# Patient Record
Sex: Female | Born: 1995 | Race: Black or African American | Hispanic: No | Marital: Single | Smoking: Never smoker
Health system: Southern US, Community
[De-identification: ages and names within clinical notes are randomized; demographics above are authoritative.]

## PROBLEM LIST (undated history)

## (undated) DIAGNOSIS — N289 Disorder of kidney and ureter, unspecified: Secondary | ICD-10-CM

## (undated) DIAGNOSIS — E119 Type 2 diabetes mellitus without complications: Secondary | ICD-10-CM

## (undated) DIAGNOSIS — F419 Anxiety disorder, unspecified: Secondary | ICD-10-CM

## (undated) DIAGNOSIS — I10 Essential (primary) hypertension: Secondary | ICD-10-CM

## (undated) HISTORY — PX: LAPAROSCOPIC GASTRIC RESTRICTIVE DUODENAL PROCEDURE (DUODENAL SWITCH): SHX6667

## (undated) HISTORY — PX: ABDOMINAL SURGERY: SHX537

## (undated) NOTE — *Deleted (*Deleted)
[facility]-EMERGENCY DEPT Provider Note   CSN: [id number] Arrival date & time: 06/12/20  1725     History Chief Complaint  Patient presents with  . Abdominal Pain    Olivia Landry is a 89 y.o. female.  HPI      Olivia Landry is a 53 y.o. female, with a history of ***, presenting to the ED with       Past Medical History:  Diagnosis Date  . Anxiety   . Diabetes mellitus without complication (HCC)   . Hypertension     There are no problems to display for this patient.   Past Surgical History:  Procedure Laterality Date  . ABDOMINAL SURGERY       OB History   No obstetric history on file.     No family history on file.  Social History   Tobacco Use  . Smoking status: Never Smoker  . Smokeless tobacco: Never Used  Vaping Use  . Vaping Use: Never used  Substance Use Topics  . Alcohol use: Not Currently  . Drug use: Yes    Types: Marijuana    Home Medications Prior to Admission medications   Not on File    Allergies    Amoxicillin, Keflex [cephalexin], Penicillins, and Sumatriptan  Review of Systems   Review of Systems  Physical Exam Updated Vital Signs BP 127/83 (BP Location: Right Arm)   Pulse 100   Temp 98.7 F (37.1 C) (Oral)   Resp (!) 24   Ht 5\' 3"  (1.6 m)   Wt 75.3 kg   LMP 05/27/2020   SpO2 96%   BMI 29.41 kg/m   Physical Exam  ED Results / Procedures / Treatments   Labs (all labs ordered are listed, but only abnormal results are displayed) Labs Reviewed  COMPREHENSIVE METABOLIC PANEL - Abnormal; Notable for the following components:      Result Value   CO2 21 (*)    Glucose, Bld 116 (*)    AST 62 (*)    All other components within normal limits  CBC - Abnormal; Notable for the following components:   RBC 3.17 (*)    Hemoglobin 9.7 (*)    HCT 30.4 (*)    All other components within normal limits  RESPIRATORY PANEL BY RT PCR (FLU A&B, COVID)  LIPASE, BLOOD  URINALYSIS, ROUTINE W REFLEX  MICROSCOPIC  PREGNANCY, URINE  BRAIN NATRIURETIC PEPTIDE  ETHANOL    EKG EKG Interpretation  Date/Time:  Sunday June 12 2020 19:33:45 EST Ventricular Rate:  86 PR Interval:    QRS Duration: 91 QT Interval:  343 QTC Calculation: 411 R Axis:   102 Text Interpretation: Sinus rhythm Borderline right axis deviation Borderline T abnormalities, inferior leads No old tracing to compare Confirmed by Pricilla Loveless [phone]) on 06/12/2020 7:42:52 PM   Radiology DG Chest 2 View  Result Date: 06/12/2020 CLINICAL DATA:  Abdominal pain and distention x2 weeks. EXAM: CHEST - 2 VIEW COMPARISON:  None. FINDINGS: The heart size and mediastinal contours are within normal limits. Both lungs are clear. The visualized skeletal structures are unremarkable. IMPRESSION: No active cardiopulmonary disease. Electronically Signed   By: Katherine Mantle M.D.   On: 06/12/2020 20:25   CT ABDOMEN PELVIS W CONTRAST  Addendum Date: 06/12/2020   ADDENDUM REPORT: 06/12/2020 20:39 ADDENDUM: These results were called by telephone at the time of interpretation on 06/12/2020 at 8:39 pm to provider Pricilla Loveless MD , who verbally acknowledged these results. Electronically Signed  By: Kreg Shropshire M.D.   On: 06/12/2020 20:39   Result Date: 06/12/2020 CLINICAL DATA:  Abdominal distension EXAM: CT ABDOMEN AND PELVIS WITH CONTRAST TECHNIQUE: Multidetector CT imaging of the abdomen and pelvis was performed using the standard protocol following bolus administration of intravenous contrast. CONTRAST:  OMNIPAQUE IOHEXOL 300 MG/ML  SOLN COMPARISON:  None FINDINGS: Lower chest: Lung bases are clear. Normal heart size. No pericardial effusion. Hepatobiliary: No focal liver abnormality is seen. Patient is post cholecystectomy. Slight prominence of the biliary tree likely related to reservoir effect. No calcified intraductal gallstones. Pancreas: No pancreatic ductal dilatation or surrounding inflammatory changes. Spleen: Normal in  size. No concerning splenic lesions. Adrenals/Urinary Tract: Normal adrenal glands. Kidneys are normally located with symmetric enhancement. No suspicious renal lesion, urolithiasis or hydronephrosis. Bladder is unremarkable. Stomach/Bowel: Challenging assessment of the bowel given fairly diffuse distension, lack of intraperitoneal fat and postsurgical changes compatible with a duodenal switch procedure. 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 Additionally, there is fairly diffuse fluid distention of loops of the more proximal biliopancreatic limb clustered in the left upper quadrant predominantly involving the jejunal segments. Beyond the region of twisting much of the small bowel is more decompressed and normal caliber albeit with extensive fecalization of the small bowel contents which could reflect some slowed intestinal transit. There is some moderate distention of the colonic segments proximally to the level of the splenic flexure where there is some irregular mural thickening possibly related to peristaltic contraction on a background of more diffuse distal colonic mural thickening. Vascular/Lymphatic: No significant vascular findings are present. No enlarged abdominal or pelvic lymph nodes. Reproductive: Normal appearance of the uterus and adnexal structures. Collapsing corpus luteum in the right ovary is a physiologically normal finding. Other: Diffuse edematous changes of the mesentery. Small volume low-attenuation free fluid in the pelvis, nonspecific. Diffuse body wall edema. Small amount of focal fluid in the right gluteal soft tissues as well. Could correlate for a small hematoma or recent injection. Musculoskeletal: No acute osseous abnormality or suspicious osseous lesion. IMPRESSION: 1.  Challenging assessment of the bowel given fairly diffuse distension, mesenteric edema, lack of intraperitoneal fat and postsurgical changes compatible with a duodenal switch procedure. 2. There is fairly significant distention of the alimentary limb with a site of mesenteric twisting in the right lower quadrant where there is some abrupt narrowing just beyond the entero-enteric suture line. Additionally, there is fairly diffuse fluid distention of the biliopancreatic limb proximal to this twisting as well. Beyond the narrowing much of the small bowel is more decompressed and normal caliber albeit with extensive fecalization of the small bowel contents which could reflect some slowed intestinal transit. 3. There is some moderate distention of the colonic segments proximally to the level of the splenic flexure where there is some irregular mural thickening possibly related to peristaltic contraction on a background of more diffuse distal colonic mural thickening of the splenic flexure through the rectosigmoid. Findings are nonspecific, but could reflect a colitis of either infectious or inflammatory etiology though may warrant further evaluation with outpatient direct visualization following resolution of acute symptoms. 4. Small volume low-attenuation free fluid in the pelvis, may be physiologic albeit with additional features of anasarca including diffuse body wall and mesenteric edema. 5. Small amount of non organized though fairly focal fluid  in the right gluteal soft tissues as well. Could correlate for a small hematoma or recent injection. Electronically Signed: By: Kreg Shropshire M.D. On: 06/12/2020 20:25    Procedures Procedures (including critical care time)  Medications Ordered in ED Medications  ketorolac (TORADOL) 15 MG/ML injection 15 mg (15 mg Intravenous Refused 06/12/20 2054)  iohexol (OMNIPAQUE) 300 MG/ML solution 100 mL (100 mLs Intravenous Contrast Given 06/12/20 1948)  morphine 4 MG/ML  injection 4 mg (4 mg Intravenous Given 06/12/20 2049)    ED Course  I have reviewed the triage vital signs and the nursing notes.  Pertinent labs & imaging results that were available during my care of the patient were reviewed by me and considered in my medical decision making (see chart for details).  Clinical Course as of Jun 13 2231  Wynelle Link Jun 12, 2020  2225 Spoke with Dr. Carolynne Edouard, general surgery. Notified him of patient's arrival at [facility] ED. He states he plans on coming to see her.    [SJ]    Clinical Course User Index [SJ] Deantae Shackleton C, PA-C   MDM Rules/Calculators/A&P                          *** Final Clinical Impression(s) / ED Diagnoses Final diagnoses:  Intestinal obstruction, unspecified cause, unspecified whether partial or complete (HCC)  Abdominal pain, unspecified abdominal location  Edema, unspecified type    Rx / DC Orders ED Discharge Orders    None

---
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
## 2015-07-20 DIAGNOSIS — M21069 Valgus deformity, not elsewhere classified, unspecified knee: Secondary | ICD-10-CM | POA: Insufficient documentation

## 2015-07-20 DIAGNOSIS — M222X9 Patellofemoral disorders, unspecified knee: Secondary | ICD-10-CM | POA: Insufficient documentation

## 2015-11-11 DIAGNOSIS — E038 Other specified hypothyroidism: Secondary | ICD-10-CM | POA: Insufficient documentation

## 2015-11-11 DIAGNOSIS — E063 Autoimmune thyroiditis: Secondary | ICD-10-CM | POA: Insufficient documentation

## 2016-12-26 DIAGNOSIS — IMO0001 Reserved for inherently not codable concepts without codable children: Secondary | ICD-10-CM | POA: Insufficient documentation

## 2017-02-08 DIAGNOSIS — F319 Bipolar disorder, unspecified: Secondary | ICD-10-CM | POA: Insufficient documentation

## 2017-02-08 DIAGNOSIS — F909 Attention-deficit hyperactivity disorder, unspecified type: Secondary | ICD-10-CM | POA: Insufficient documentation

## 2017-02-08 DIAGNOSIS — I10 Essential (primary) hypertension: Secondary | ICD-10-CM | POA: Insufficient documentation

## 2017-06-17 DIAGNOSIS — Z9884 Bariatric surgery status: Secondary | ICD-10-CM | POA: Insufficient documentation

## 2017-09-19 DIAGNOSIS — R600 Localized edema: Secondary | ICD-10-CM | POA: Insufficient documentation

## 2017-09-24 DIAGNOSIS — Z4889 Encounter for other specified surgical aftercare: Secondary | ICD-10-CM | POA: Insufficient documentation

## 2017-09-30 DIAGNOSIS — E669 Obesity, unspecified: Secondary | ICD-10-CM | POA: Insufficient documentation

## 2017-09-30 DIAGNOSIS — Z6833 Body mass index (BMI) 33.0-33.9, adult: Secondary | ICD-10-CM | POA: Insufficient documentation

## 2017-09-30 DIAGNOSIS — L259 Unspecified contact dermatitis, unspecified cause: Secondary | ICD-10-CM | POA: Insufficient documentation

## 2018-03-27 DIAGNOSIS — E113299 Type 2 diabetes mellitus with mild nonproliferative diabetic retinopathy without macular edema, unspecified eye: Secondary | ICD-10-CM | POA: Insufficient documentation

## 2018-04-29 DIAGNOSIS — F32A Depression, unspecified: Secondary | ICD-10-CM | POA: Insufficient documentation

## 2018-04-29 DIAGNOSIS — K219 Gastro-esophageal reflux disease without esophagitis: Secondary | ICD-10-CM | POA: Insufficient documentation

## 2020-06-12 ENCOUNTER — Observation Stay (HOSPITAL_BASED_OUTPATIENT_CLINIC_OR_DEPARTMENT_OTHER)
Admission: EM | Admit: 2020-06-12 | Discharge: 2020-06-13 | Disposition: A | Discharge status: Home / Self Care | Payer: BC Managed Care – PPO | Attending: Surgery | Admitting: Surgery

## 2020-06-12 ENCOUNTER — Other Ambulatory Visit: Payer: Self-pay

## 2020-06-12 ENCOUNTER — Emergency Department (HOSPITAL_BASED_OUTPATIENT_CLINIC_OR_DEPARTMENT_OTHER): Payer: BC Managed Care – PPO

## 2020-06-12 ENCOUNTER — Encounter (HOSPITAL_BASED_OUTPATIENT_CLINIC_OR_DEPARTMENT_OTHER): Payer: Self-pay | Admitting: Emergency Medicine

## 2020-06-12 DIAGNOSIS — K219 Gastro-esophageal reflux disease without esophagitis: Secondary | ICD-10-CM | POA: Insufficient documentation

## 2020-06-12 DIAGNOSIS — I10 Essential (primary) hypertension: Secondary | ICD-10-CM | POA: Insufficient documentation

## 2020-06-12 DIAGNOSIS — F909 Attention-deficit hyperactivity disorder, unspecified type: Secondary | ICD-10-CM | POA: Diagnosis not present

## 2020-06-12 DIAGNOSIS — R109 Unspecified abdominal pain: Secondary | ICD-10-CM

## 2020-06-12 DIAGNOSIS — R1084 Generalized abdominal pain: Secondary | ICD-10-CM | POA: Diagnosis present

## 2020-06-12 DIAGNOSIS — K56609 Unspecified intestinal obstruction, unspecified as to partial versus complete obstruction: Principal | ICD-10-CM | POA: Insufficient documentation

## 2020-06-12 DIAGNOSIS — Z79899 Other long term (current) drug therapy: Secondary | ICD-10-CM | POA: Diagnosis not present

## 2020-06-12 DIAGNOSIS — Z7984 Long term (current) use of oral hypoglycemic drugs: Secondary | ICD-10-CM | POA: Insufficient documentation

## 2020-06-12 DIAGNOSIS — Z20822 Contact with and (suspected) exposure to covid-19: Secondary | ICD-10-CM | POA: Insufficient documentation

## 2020-06-12 DIAGNOSIS — R609 Edema, unspecified: Secondary | ICD-10-CM

## 2020-06-12 DIAGNOSIS — R2243 Localized swelling, mass and lump, lower limb, bilateral: Secondary | ICD-10-CM | POA: Diagnosis not present

## 2020-06-12 DIAGNOSIS — R0602 Shortness of breath: Secondary | ICD-10-CM | POA: Diagnosis not present

## 2020-06-12 DIAGNOSIS — E119 Type 2 diabetes mellitus without complications: Secondary | ICD-10-CM | POA: Insufficient documentation

## 2020-06-12 DIAGNOSIS — R110 Nausea: Secondary | ICD-10-CM

## 2020-06-12 HISTORY — DX: Anxiety disorder, unspecified: F41.9

## 2020-06-12 HISTORY — DX: Type 2 diabetes mellitus without complications: E11.9

## 2020-06-12 HISTORY — DX: Essential (primary) hypertension: I10

## 2020-06-12 LAB — COMPREHENSIVE METABOLIC PANEL
ALT: 41 U/L (ref 0–44)
AST: 62 U/L — ABNORMAL HIGH (ref 15–41)
Albumin: 4.1 g/dL (ref 3.5–5.0)
Alkaline Phosphatase: 46 U/L (ref 38–126)
Anion gap: 11 (ref 5–15)
BUN: 20 mg/dL (ref 6–20)
CO2: 21 mmol/L — ABNORMAL LOW (ref 22–32)
Calcium: 9.1 mg/dL (ref 8.9–10.3)
Chloride: 108 mmol/L (ref 98–111)
Creatinine, Ser: 0.79 mg/dL (ref 0.44–1.00)
GFR, Estimated: >60 mL/min (ref >60)
Glucose, Bld: 116 mg/dL — ABNORMAL HIGH (ref 70–99)
Potassium: 3.9 mmol/L (ref 3.5–5.1)
Sodium: 140 mmol/L (ref 135–145)
Total Bilirubin: 0.7 mg/dL (ref 0.3–1.2)
Total Protein: 6.8 g/dL (ref 6.5–8.1)

## 2020-06-12 LAB — RESPIRATORY PANEL BY RT PCR (FLU A&B, COVID)
Influenza A by PCR: NEGATIVE
Influenza B by PCR: NEGATIVE
SARS Coronavirus 2 by RT PCR: NEGATIVE

## 2020-06-12 LAB — URINALYSIS, ROUTINE W REFLEX MICROSCOPIC
Bilirubin Urine: NEGATIVE
Glucose, UA: NEGATIVE mg/dL
Hgb urine dipstick: NEGATIVE
Ketones, ur: NEGATIVE mg/dL
Leukocytes,Ua: NEGATIVE
Nitrite: NEGATIVE
Protein, ur: NEGATIVE mg/dL
Specific Gravity, Urine: 1.02 (ref 1.005–1.030)
pH: 6 (ref 5.0–8.0)

## 2020-06-12 LAB — LIPASE, BLOOD: Lipase: 34 U/L (ref 11–51)

## 2020-06-12 LAB — CBC
HCT: 30.4 % — ABNORMAL LOW (ref 36.0–46.0)
Hemoglobin: 9.7 g/dL — ABNORMAL LOW (ref 12.0–15.0)
MCH: 30.6 pg (ref 26.0–34.0)
MCHC: 31.9 g/dL (ref 30.0–36.0)
MCV: 95.9 fL (ref 80.0–100.0)
Platelets: 331 10*3/uL (ref 150–400)
RBC: 3.17 MIL/uL — ABNORMAL LOW (ref 3.87–5.11)
RDW: 12.8 % (ref 11.5–15.5)
WBC: 5.9 10*3/uL (ref 4.0–10.5)
nRBC: 0 % (ref 0.0–0.2)

## 2020-06-12 LAB — ETHANOL: Alcohol, Ethyl (B): <10 mg/dL (ref <10)

## 2020-06-12 LAB — PREGNANCY, URINE: Preg Test, Ur: NEGATIVE

## 2020-06-12 LAB — BRAIN NATRIURETIC PEPTIDE: B Natriuretic Peptide: 14.1 pg/mL (ref 0.0–100.0)

## 2020-06-12 MED ORDER — MORPHINE SULFATE (PF) 4 MG/ML IV SOLN
4.0000 mg | Freq: Once | INTRAVENOUS | Status: AC
  Administered 2020-06-12: 4 mg via INTRAVENOUS
  Filled 2020-06-12: qty 1

## 2020-06-12 MED ORDER — IOHEXOL 300 MG/ML  SOLN
100.0000 mL | Freq: Once | INTRAMUSCULAR | Status: AC
  Administered 2020-06-12: 100 mL via INTRAVENOUS

## 2020-06-12 MED ORDER — MORPHINE SULFATE (PF) 4 MG/ML IV SOLN
4.0000 mg | Freq: Once | INTRAVENOUS | Status: AC
  Administered 2020-06-13: 4 mg via INTRAVENOUS
  Filled 2020-06-12: qty 1

## 2020-06-12 MED ORDER — KETOROLAC TROMETHAMINE 15 MG/ML IJ SOLN
15.0000 mg | Freq: Once | INTRAMUSCULAR | Status: DC
  Filled 2020-06-12: qty 1

## 2020-06-12 NOTE — ED Triage Notes (Signed)
Reports abdomen has been distended and painful for the last two weeks.  Hx of duodenal switch sx in 2017.  Denies any n/v/d.  Also co swelling to bil lower ext for the last two weeks.

## 2020-06-12 NOTE — ED Provider Notes (Signed)
[facility] EMERGENCY DEPARTMENT Provider Note   CSN: [id number] Arrival date & time: 06/12/20  1725     History Chief Complaint  Patient presents with  . Abdominal Pain    Olivia Landry is a 24 y.o. female presenting for evaluation of abdominal pain and swelling.  Patient states she has had gradual weight gain over the past several weeks.  In the past week, she reports a weight gain of 13 pounds.  The past 2 weeks, she has had worsening abdominal pain and swelling, pain is worse on the right side.  She treated with Linzess, which is helped with her constipation before, and reports increased gas and bowel output, but no improvement of symptoms.  She has had abdominal bloating before due to constipation, but usually resolves quickly.  She denies fevers, chills, chest pain, nausea, vomiting, urinary symptoms.  She has blood in her stools. Last mild leg swelling, patient denies a history of similar.  However she states she is on Lasix, does not know why.  She does report mild dyspnea on exertion and orthopnea over the past several weeks. Patient with significant surgical history including lap duodenal switch in 2017 at [facility] in [place], bariatric surgery, lap chole.   Additional history obtained in chart review.  Patient with a history of ADHD, anxiety, depression, diabetes, GERD, hypertension, edema.  She is on BuSpar, Hyzaar, Wellbutrin, Celexa, Catapres, Adderall, iron, Lasix, Neurontin, Lamictal, lithium, Metformin, Trileptal. Recently d/c ozempic.   HPI     Past Medical History:  Diagnosis Date  . Anxiety   . Diabetes mellitus without complication (HCC)   . Hypertension     There are no problems to display for this patient.   Past Surgical History:  Procedure Laterality Date  . ABDOMINAL SURGERY       OB History   No obstetric history on file.     No family history on file.  Social History   Tobacco Use  . Smoking status: Never Smoker  . Smokeless  tobacco: Never Used  Vaping Use  . Vaping Use: Never used  Substance Use Topics  . Alcohol use: Not Currently  . Drug use: Yes    Types: Marijuana    Home Medications Prior to Admission medications   Not on File    Allergies    Amoxicillin, Keflex [cephalexin], Penicillins, and Sumatriptan  Review of Systems   Review of Systems  Constitutional: Positive for unexpected weight change.  Respiratory: Positive for shortness of breath.   Cardiovascular: Positive for leg swelling.  Gastrointestinal: Positive for abdominal distention and abdominal pain.  All other systems reviewed and are negative.   Physical Exam Updated Vital Signs BP 132/85 (BP Location: Right Arm)   Pulse 93   Temp 98.7 F (37.1 C) (Oral)   Resp 18   Ht 5\' 3"  (1.6 m)   Wt 75.3 kg   LMP 05/27/2020   SpO2 100%   BMI 29.41 kg/m   Physical Exam Vitals and nursing note reviewed.  Constitutional:      General: She is not in acute distress.    Appearance: She is well-developed.     Comments: Appears nontoxic  HENT:     Head: Normocephalic and atraumatic.  Eyes:     Conjunctiva/sclera: Conjunctivae normal.     Pupils: Pupils are equal, round, and reactive to light.  Cardiovascular:     Rate and Rhythm: Normal rate and regular rhythm.     Pulses: Normal pulses.  Pulmonary:  Effort: Pulmonary effort is normal. No respiratory distress.     Breath sounds: Normal breath sounds. No wheezing.     Comments: Clear lung sounds Abdominal:     General: There is no distension.     Palpations: Abdomen is soft. There is no mass.     Tenderness: There is abdominal tenderness. There is no guarding or rebound.     Comments: Mild tenderness palpation of the right side abdomen.  No rigidity, guarding, distention.  Negative rebound.  Musculoskeletal:        General: Normal range of motion.     Cervical back: Normal range of motion and neck supple.     Right lower leg: Edema present.     Left lower leg: Edema  present.     Comments: Mild bilateral lower extremity edema.  Skin:    General: Skin is warm and dry.  Neurological:     Mental Status: She is alert and oriented to person, place, and time.     ED Results / Procedures / Treatments   Labs (all labs ordered are listed, but only abnormal results are displayed) Labs Reviewed  COMPREHENSIVE METABOLIC PANEL - Abnormal; Notable for the following components:      Result Value   CO2 21 (*)    Glucose, Bld 116 (*)    AST 62 (*)    All other components within normal limits  CBC - Abnormal; Notable for the following components:   RBC 3.17 (*)    Hemoglobin 9.7 (*)    HCT 30.4 (*)    All other components within normal limits  LIPASE, BLOOD  URINALYSIS, ROUTINE W REFLEX MICROSCOPIC  PREGNANCY, URINE  BRAIN NATRIURETIC PEPTIDE  ETHANOL    EKG EKG Interpretation  Date/Time:  Sunday June 12 2020 19:33:45 EST Ventricular Rate:  86 PR Interval:    QRS Duration: 91 QT Interval:  343 QTC Calculation: 411 R Axis:   102 Text Interpretation: Sinus rhythm Borderline right axis deviation Borderline T abnormalities, inferior leads No old tracing to compare Confirmed by Pricilla Loveless [phone]) on 06/12/2020 7:42:52 PM   Radiology DG Chest 2 View  Result Date: 06/12/2020 CLINICAL DATA:  Abdominal pain and distention x2 weeks. EXAM: CHEST - 2 VIEW COMPARISON:  None. FINDINGS: The heart size and mediastinal contours are within normal limits. Both lungs are clear. The visualized skeletal structures are unremarkable. IMPRESSION: No active cardiopulmonary disease. Electronically Signed   By: Katherine Mantle M.D.   On: 06/12/2020 20:25   CT ABDOMEN PELVIS W CONTRAST  Addendum Date: 06/12/2020   ADDENDUM REPORT: 06/12/2020 20:39 ADDENDUM: These results were called by telephone at the time of interpretation on 06/12/2020 at 8:39 pm to provider Pricilla Loveless MD , who verbally acknowledged these results. Electronically Signed   By: Kreg Shropshire  M.D.   On: 06/12/2020 20:39   Result Date: 06/12/2020 CLINICAL DATA:  Abdominal distension EXAM: CT ABDOMEN AND PELVIS WITH CONTRAST TECHNIQUE: Multidetector CT imaging of the abdomen and pelvis was performed using the standard protocol following bolus administration of intravenous contrast. CONTRAST:  OMNIPAQUE IOHEXOL 300 MG/ML  SOLN COMPARISON:  None FINDINGS: Lower chest: Lung bases are clear. Normal heart size. No pericardial effusion. Hepatobiliary: No focal liver abnormality is seen. Patient is post cholecystectomy. Slight prominence of the biliary tree likely related to reservoir effect. No calcified intraductal gallstones. Pancreas: No pancreatic ductal dilatation or surrounding inflammatory changes. Spleen: Normal in size. No concerning splenic lesions. Adrenals/Urinary Tract: Normal adrenal glands. Kidneys  are normally located with symmetric enhancement. No suspicious renal lesion, urolithiasis or hydronephrosis. Bladder is unremarkable. Stomach/Bowel: Challenging assessment of the bowel given fairly diffuse distension, lack of intraperitoneal fat and postsurgical changes compatible with a duodenal switch procedure. 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 Additionally, there is fairly diffuse fluid distention of loops of the more proximal biliopancreatic limb clustered in the left upper quadrant predominantly involving the jejunal segments. Beyond the region of twisting much of the small bowel is more decompressed and normal caliber albeit with extensive fecalization of the small bowel contents which could reflect some slowed intestinal transit. There is some moderate distention of the colonic segments proximally to the level of the splenic flexure where there is some irregular mural  thickening possibly related to peristaltic contraction on a background of more diffuse distal colonic mural thickening. Vascular/Lymphatic: No significant vascular findings are present. No enlarged abdominal or pelvic lymph nodes. Reproductive: Normal appearance of the uterus and adnexal structures. Collapsing corpus luteum in the right ovary is a physiologically normal finding. Other: Diffuse edematous changes of the mesentery. Small volume low-attenuation free fluid in the pelvis, nonspecific. Diffuse body wall edema. Small amount of focal fluid in the right gluteal soft tissues as well. Could correlate for a small hematoma or recent injection. Musculoskeletal: No acute osseous abnormality or suspicious osseous lesion. IMPRESSION: 1. Challenging assessment of the bowel given fairly diffuse distension, mesenteric edema, lack of intraperitoneal fat and postsurgical changes compatible with a duodenal switch procedure. 2. There is fairly significant distention of the alimentary limb with a site of mesenteric twisting in the right lower quadrant where there is some abrupt narrowing just beyond the entero-enteric suture line. Additionally, there is fairly diffuse fluid distention of the biliopancreatic limb proximal to this twisting as well. Beyond the narrowing much of the small bowel is more decompressed and normal caliber albeit with extensive fecalization of the small bowel contents which could reflect some slowed intestinal transit. 3. There is some moderate distention of the colonic segments proximally to the level of the splenic flexure where there is some irregular mural thickening possibly related to peristaltic contraction on a background of more diffuse distal colonic mural thickening of the splenic flexure through the rectosigmoid. Findings are nonspecific, but could reflect a colitis of either infectious or inflammatory etiology though may warrant further evaluation with outpatient direct visualization  following resolution of acute symptoms. 4. Small volume low-attenuation free fluid in the pelvis, may be physiologic albeit with additional features of anasarca including diffuse body wall and mesenteric edema. 5. Small amount of non organized though fairly focal fluid in the right gluteal soft tissues as well. Could correlate for a small hematoma or recent injection. Electronically Signed: By: Kreg ShropshirePrice  DeHay M.D. On: 06/12/2020 20:25    Procedures Procedures (including critical care time)  Medications Ordered in ED Medications  ketorolac (TORADOL) 15 MG/ML injection 15 mg (15 mg Intravenous Refused 06/12/20 2054)  iohexol (OMNIPAQUE) 300 MG/ML solution 100 mL (100 mLs Intravenous Contrast Given 06/12/20 1948)  morphine 4 MG/ML injection 4 mg (4 mg Intravenous Given 06/12/20 2049)    ED Course  I have reviewed the triage vital signs and the nursing notes.  Pertinent labs & imaging results that were available during my care of the patient were reviewed by me and considered  in my medical decision making (see chart for details).    MDM Rules/Calculators/A&P                          Patient presenting for evaluation of abdominal pain and edema.  On exam, patient appears nontoxic.  She does have mild right-sided abdominal pain.  She also is noted to have peripheral edema.  Labs obtained from triage interpreted by me, overall reassuring.  No leukocytosis.  Electrolytes are stable.  Lipase normal.  UA without infection.  Considering her significant abdominal surgery, she will need CT imaging for further evaluation of symptoms.  Consider obstruction, infection, liver pathology.  Additionally, patient reporting shortness of breath and with edema, will obtain BNP and chest x-ray.  BNP negative.  Chest x-ray viewed interpreted by me, no pneumonia, proximal effusion.  CT abdomen pelvis with abnormal read, per radiologist there is concern for obstruction.  Patient with twisting of the right side abdomen with  subsequent distention.  Will consult general surgery.  Discussed with Dr. Carolynne Edouard from general surgery who requested ED to ED transfer so he can evaluate and admit the patient.  Requests call when patient arrives at [facility], ER. Discussed findings and plan with pt, who is agreeable.   Discussed with Dr. Jeraldine Loots who accepts patient for transfer.  Final Clinical Impression(s) / ED Diagnoses Final diagnoses:  None    Rx / DC Orders ED Discharge Orders    None       Alveria Apley, PA-C 06/12/20 2128    Pricilla Loveless, MD 06/15/20 [phone]

## 2020-06-12 NOTE — H&P (Signed)
Olivia Landry is an 24 y.o. female.   Chief Complaint: abd pressure HPI: The patient is a 24 year old black female who presents with abdominal pressure that is been going on for the last couple weeks.  She had one episode of vomiting about a week ago.  Otherwise she has about 8 bowel movements a day.  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  She denies any fevers or chills.  She came to the emergency department where a CT scan shows dilation of both the large and small intestine.  There is mesenteric edema.  Past Medical History:  Diagnosis Date  . Anxiety   . Diabetes mellitus without complication (HCC)   . Hypertension     Past Surgical History:  Procedure Laterality Date  . ABDOMINAL SURGERY      No family history on file. Social History:  reports that she has never smoked. She has never used smokeless tobacco. She reports previous alcohol use. She reports current drug use. Drug: Marijuana.  Allergies:  Allergies  Allergen Reactions  . Amoxicillin Hives  . Keflex [Cephalexin] Hives  . Penicillins Hives  . Sumatriptan Other (See Comments)    Nausea, dizzy, hot, and break out in sweats.    (Not in a hospital admission)   Results for orders placed or performed during the hospital encounter of 06/12/20 (from the past 48 hour(s))  Lipase, blood     Status: None   Collection Time: 06/12/20  5:45 PM  Result Value Ref Range   Lipase 34 11 - 51 U/L    Comment: Performed at [facility], [address] [place], [place] [address]  Comprehensive metabolic panel     Status: Abnormal   Collection Time: 06/12/20  5:45 PM  Result Value Ref Range   Sodium 140 135 - 145 mmol/L   Potassium 3.9 3.5 - 5.1 mmol/L   Chloride 108 98 - 111 mmol/L   CO2 21 (L) 22 - 32 mmol/L   Glucose, Bld 116 (H) 70 - 99 mg/dL    Comment: Glucose reference range applies only to samples taken after fasting for at least 8 hours.    BUN 20 6 - 20 mg/dL   Creatinine, Ser 9.62 0.44 - 1.00 mg/dL   Calcium 9.1 8.9 - 83.6 mg/dL   Total Protein 6.8 6.5 - 8.1 g/dL   Albumin 4.1 3.5 - 5.0 g/dL   AST 62 (H) 15 - 41 U/L   ALT 41 0 - 44 U/L   Alkaline Phosphatase 46 38 - 126 U/L   Total Bilirubin 0.7 0.3 - 1.2 mg/dL   GFR, Estimated >62 >94 mL/min    Comment: (NOTE) Calculated using the CKD-EPI Creatinine Equation (2021)    Anion gap 11 5 - 15    Comment: Performed at [facility], [address] [place], [place] [address]  CBC     Status: Abnormal   Collection Time: 06/12/20  5:45 PM  Result Value Ref Range   WBC 5.9 4.0 - 10.5 K/uL   RBC 3.17 (L) 3.87 - 5.11 MIL/uL   Hemoglobin 9.7 (L) 12.0 - 15.0 g/dL   HCT 50.3 (L) 36 - 46 %   MCV 95.9 80.0 - 100.0 fL   MCH 30.6 26.0 - 34.0 pg   MCHC 31.9 30.0 - 36.0 g/dL   RDW 54.6 56.8 - 12.7 %   Platelets 331 150 - 400 K/uL  nRBC 0.0 0.0 - 0.2 %    Comment: Performed at [facility] [place], [address] [place], [place] [address]  Urinalysis, Routine w reflex microscopic     Status: None   Collection Time: 06/12/20  5:51 PM  Result Value Ref Range   Color, Urine YELLOW YELLOW   APPearance CLEAR CLEAR   Specific Gravity, Urine 1.020 1.005 - 1.030   pH 6.0 5.0 - 8.0   Glucose, UA NEGATIVE NEGATIVE mg/dL   Hgb urine dipstick NEGATIVE NEGATIVE   Bilirubin Urine NEGATIVE NEGATIVE   Ketones, ur NEGATIVE NEGATIVE mg/dL   Protein, ur NEGATIVE NEGATIVE mg/dL   Nitrite NEGATIVE NEGATIVE   Leukocytes,Ua NEGATIVE NEGATIVE    Comment: Microscopic not done on urines with negative protein, blood, leukocytes, nitrite, or glucose < 500 mg/dL. Performed at [facility] [place], [address] [place], [place] [address]   Pregnancy, urine     Status: None   Collection Time: 06/12/20  5:51 PM  Result Value Ref Range   Preg Test, Ur NEGATIVE NEGATIVE    Comment:        THE SENSITIVITY OF THIS METHODOLOGY IS >20 mIU/mL. Performed at [facility] [place], [address] [place], [place] [address]   Brain natriuretic peptide     Status: None   Collection Time: 06/12/20  7:30 PM  Result Value Ref Range   B Natriuretic Peptide 14.1 0.0 - 100.0 pg/mL    Comment: Performed at [facility] [place], [address] [place], [place] [address]  Ethanol     Status: None   Collection Time: 06/12/20  7:34 PM  Result Value Ref Range   Alcohol, Ethyl (B) <10 <10 mg/dL    Comment: (NOTE) Lowest detectable limit for serum alcohol is 10 mg/dL.  For medical purposes only. Performed at [facility] [place], [address] [place], [place] [address]    DG Chest 2 View  Result Date: 06/12/2020 CLINICAL DATA:  Abdominal pain and distention x2 weeks. EXAM: CHEST - 2 VIEW COMPARISON:  None. FINDINGS: The heart size and mediastinal contours are within normal limits. Both lungs are clear. The visualized skeletal structures are unremarkable. IMPRESSION: No active cardiopulmonary disease. Electronically Signed   By: Katherine Mantlehristopher  Green M.D.   On: 06/12/2020 20:25   CT ABDOMEN PELVIS W CONTRAST  Addendum Date: 06/12/2020   ADDENDUM REPORT: 06/12/2020 20:39 ADDENDUM: These results were called by telephone at the time of interpretation on 06/12/2020 at 8:39 pm to provider Pricilla LovelessScott Goldston MD , who verbally acknowledged these results. Electronically Signed   By: Kreg ShropshirePrice  DeHay M.D.   On: 06/12/2020 20:39   Result Date: 06/12/2020 CLINICAL DATA:  Abdominal distension EXAM: CT ABDOMEN AND PELVIS WITH CONTRAST TECHNIQUE: Multidetector CT imaging of the abdomen and pelvis was performed using the standard protocol following bolus administration of intravenous contrast. CONTRAST:  100mL OMNIPAQUE IOHEXOL 300 MG/ML  SOLN COMPARISON:  None FINDINGS: Lower chest: Lung bases are clear. Normal heart size. No pericardial effusion. Hepatobiliary: No focal liver abnormality is seen. Patient is post cholecystectomy. Slight prominence of the biliary tree likely related to reservoir  effect. No calcified intraductal gallstones. Pancreas: No pancreatic ductal dilatation or surrounding inflammatory changes. Spleen: Normal in size. No concerning splenic lesions. Adrenals/Urinary Tract: Normal adrenal glands. Kidneys are normally located with symmetric enhancement. No suspicious renal lesion, urolithiasis or hydronephrosis. Bladder is unremarkable. Stomach/Bowel: Challenging assessment of the bowel given fairly diffuse distension, lack of  intraperitoneal fat and postsurgical changes compatible with a duodenal switch procedure. 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 Additionally, there is fairly diffuse fluid distention of loops of the more proximal biliopancreatic limb clustered in the left upper quadrant predominantly involving the jejunal segments. Beyond the region of twisting much of the small bowel is more decompressed and normal caliber albeit with extensive fecalization of the small bowel contents which could reflect some slowed intestinal transit. There is some moderate distention of the colonic segments proximally to the level of the splenic flexure where there is some irregular mural thickening possibly related to peristaltic contraction on a background of more diffuse distal colonic mural thickening. Vascular/Lymphatic: No significant vascular findings are present. No enlarged abdominal or pelvic lymph nodes. Reproductive: Normal appearance of the uterus and adnexal structures. Collapsing corpus luteum in the right ovary is a physiologically normal finding. Other: Diffuse edematous changes of the mesentery. Small volume low-attenuation free fluid in the pelvis, nonspecific. Diffuse body wall edema. Small amount of focal fluid in the right gluteal soft tissues as well. Could  correlate for a small hematoma or recent injection. Musculoskeletal: No acute osseous abnormality or suspicious osseous lesion. IMPRESSION: 1. Challenging assessment of the bowel given fairly diffuse distension, mesenteric edema, lack of intraperitoneal fat and postsurgical changes compatible with a duodenal switch procedure. 2. There is fairly significant distention of the alimentary limb with a site of mesenteric twisting in the right lower quadrant where there is some abrupt narrowing just beyond the entero-enteric suture line. Additionally, there is fairly diffuse fluid distention of the biliopancreatic limb proximal to this twisting as well. Beyond the narrowing much of the small bowel is more decompressed and normal caliber albeit with extensive fecalization of the small bowel contents which could reflect some slowed intestinal transit. 3. There is some moderate distention of the colonic segments proximally to the level of the splenic flexure where there is some irregular mural thickening possibly related to peristaltic contraction on a background of more diffuse distal colonic mural thickening of the splenic flexure through the rectosigmoid. Findings are nonspecific, but could reflect a colitis of either infectious or inflammatory etiology though may warrant further evaluation with outpatient direct visualization following resolution of acute symptoms. 4. Small volume low-attenuation free fluid in the pelvis, may be physiologic albeit with additional features of anasarca including diffuse body wall and mesenteric edema. 5. Small amount of non organized though fairly focal fluid in the right gluteal soft tissues as well. Could correlate for a small hematoma or recent injection. Electronically Signed: By: Kreg Shropshire M.D. On: 06/12/2020 20:25    Review of Systems  Constitutional: Negative.   HENT: Negative.   Eyes: Negative.   Respiratory: Negative.   Cardiovascular: Negative.   Gastrointestinal:  Positive for abdominal distention and diarrhea.  Endocrine: Negative.   Genitourinary: Negative.   Musculoskeletal: Negative.   Skin: Negative.   Allergic/Immunologic: Negative.   Neurological: Negative.   Hematological: Negative.   Psychiatric/Behavioral: Negative.     Blood pressure 127/83, pulse 100, temperature 98.7 F (37.1 C), temperature source Oral, resp. rate (!) 24, height 5\' 3"  (1.6 m), weight 75.3 kg, last menstrual period 05/27/2020, SpO2 96 %. Physical Exam Constitutional:      General: She is not in acute distress.    Appearance: Normal appearance. She  is normal weight.  HENT:     Head: Normocephalic and atraumatic.     Right Ear: External ear normal.     Left Ear: External ear normal.     Nose: Nose normal.     Mouth/Throat:     Mouth: Mucous membranes are moist.     Pharynx: Oropharynx is clear.  Eyes:     General: No scleral icterus.    Extraocular Movements: Extraocular movements intact.     Conjunctiva/sclera: Conjunctivae normal.     Pupils: Pupils are equal, round, and reactive to light.  Cardiovascular:     Rate and Rhythm: Normal rate and regular rhythm.     Pulses: Normal pulses.     Heart sounds: Normal heart sounds.     Comments: No pitting edema lower extr Pulmonary:     Effort: Pulmonary effort is normal. No respiratory distress.     Breath sounds: Normal breath sounds.  Abdominal:     Palpations: Abdomen is soft.     Comments: There is mild distension and mild tenderness. No peritonitis. No palpable mass  Musculoskeletal:        General: No tenderness or deformity. Normal range of motion.     Cervical back: Normal range of motion. No tenderness.  Skin:    General: Skin is warm and dry.     Coloration: Skin is not jaundiced.     Findings: No rash.  Neurological:     General: No focal deficit present.     Mental Status: She is alert and oriented to person, place, and time.  Psychiatric:        Mood and Affect: Mood normal.         Behavior: Behavior normal.        Thought Content: Thought content normal.      Assessment/Plan The patient appears to have possibly some level of partial obstruction or motility dysfunction of the small and large intestine several years after having a duodenal switch operation.  She does not appear to be acutely ill.  At this point I would recommend admission to the hospital with bowel rest and IV hydration.  We will discuss her situation with the primary team in the morning.  She may also need a gastroenterology evaluation.  Chevis Pretty III, MD 06/12/2020, 11:25 PM

## 2020-06-13 ENCOUNTER — Encounter (HOSPITAL_COMMUNITY): Payer: Self-pay | Admitting: General Surgery

## 2020-06-13 ENCOUNTER — Inpatient Hospital Stay (HOSPITAL_COMMUNITY): Payer: BC Managed Care – PPO

## 2020-06-13 LAB — CBC
HCT: 27 % — ABNORMAL LOW (ref 36.0–46.0)
Hemoglobin: 8.4 g/dL — ABNORMAL LOW (ref 12.0–15.0)
MCH: 30.7 pg (ref 26.0–34.0)
MCHC: 31.1 g/dL (ref 30.0–36.0)
MCV: 98.5 fL (ref 80.0–100.0)
Platelets: 282 10*3/uL (ref 150–400)
RBC: 2.74 MIL/uL — ABNORMAL LOW (ref 3.87–5.11)
RDW: 13 % (ref 11.5–15.5)
WBC: 5.9 10*3/uL (ref 4.0–10.5)
nRBC: 0 % (ref 0.0–0.2)

## 2020-06-13 LAB — BASIC METABOLIC PANEL
Anion gap: 6 (ref 5–15)
BUN: 18 mg/dL (ref 6–20)
CO2: 24 mmol/L (ref 22–32)
Calcium: 8.1 mg/dL — ABNORMAL LOW (ref 8.9–10.3)
Chloride: 108 mmol/L (ref 98–111)
Creatinine, Ser: 0.68 mg/dL (ref 0.44–1.00)
GFR, Estimated: >60 mL/min (ref >60)
Glucose, Bld: 138 mg/dL — ABNORMAL HIGH (ref 70–99)
Potassium: 3.6 mmol/L (ref 3.5–5.1)
Sodium: 138 mmol/L (ref 135–145)

## 2020-06-13 LAB — HIV ANTIBODY (ROUTINE TESTING W REFLEX): HIV Screen 4th Generation wRfx: NONREACTIVE

## 2020-06-13 LAB — PHOSPHORUS: Phosphorus: 4.6 mg/dL (ref 2.5–4.6)

## 2020-06-13 LAB — FOLATE: Folate: 14.9 ng/mL (ref >5.9)

## 2020-06-13 LAB — VITAMIN B12: Vitamin B-12: 810 pg/mL (ref 180–914)

## 2020-06-13 LAB — PREALBUMIN: Prealbumin: 17.3 mg/dL — ABNORMAL LOW (ref 18–38)

## 2020-06-13 LAB — MAGNESIUM: Magnesium: 2 mg/dL (ref 1.7–2.4)

## 2020-06-13 MED ORDER — HEPARIN SODIUM (PORCINE) 5000 UNIT/ML IJ SOLN
5000.0000 [IU] | Freq: Three times a day (TID) | INTRAMUSCULAR | Status: DC
  Administered 2020-06-13: 5000 [IU] via SUBCUTANEOUS
  Filled 2020-06-13 (×2): qty 1

## 2020-06-13 MED ORDER — KCL IN DEXTROSE-NACL 20-5-0.9 MEQ/L-%-% IV SOLN
INTRAVENOUS | Status: DC
  Filled 2020-06-13 (×2): qty 1000

## 2020-06-13 MED ORDER — ACETAMINOPHEN 500 MG PO TABS: ORAL_TABLET | ORAL | 0 refills | Status: AC

## 2020-06-13 MED ORDER — METRONIDAZOLE IN NACL 5-0.79 MG/ML-% IV SOLN
500.0000 mg | Freq: Three times a day (TID) | INTRAVENOUS | Status: DC
  Administered 2020-06-13 (×2): 500 mg via INTRAVENOUS
  Filled 2020-06-13 (×2): qty 100

## 2020-06-13 MED ORDER — MORPHINE SULFATE (PF) 2 MG/ML IV SOLN
1.0000 mg | INTRAVENOUS | Status: DC | PRN
  Administered 2020-06-13 (×3): 2 mg via INTRAVENOUS
  Filled 2020-06-13 (×3): qty 1

## 2020-06-13 MED ORDER — CIPROFLOXACIN IN D5W 400 MG/200ML IV SOLN
400.0000 mg | Freq: Two times a day (BID) | INTRAVENOUS | Status: DC
  Administered 2020-06-13: 400 mg via INTRAVENOUS
  Filled 2020-06-13 (×3): qty 200

## 2020-06-13 MED ORDER — PANTOPRAZOLE SODIUM 40 MG IV SOLR
40.0000 mg | Freq: Every day | INTRAVENOUS | Status: DC
  Administered 2020-06-13: 40 mg via INTRAVENOUS
  Filled 2020-06-13: qty 40

## 2020-06-13 MED ORDER — ONDANSETRON 4 MG PO TBDP: 4.0000 mg | ORAL_TABLET | Freq: Four times a day (QID) | ORAL | Status: DC | PRN

## 2020-06-13 MED ORDER — ONDANSETRON HCL 4 MG/2ML IJ SOLN: 4.0000 mg | Freq: Four times a day (QID) | INTRAMUSCULAR | Status: DC | PRN

## 2020-06-13 MED ORDER — TRAMADOL HCL 50 MG PO TABS
ORAL_TABLET | ORAL | 0 refills | Status: DC
Start: 2020-06-13 — End: 2021-02-14

## 2020-06-13 NOTE — Discharge Instructions (Signed)
Full Liquid Diet A full liquid diet refers to fluids and foods that are liquid or will become liquid at room temperature. This diet should only be used for a short period of time to help you recover from illness or surgery. Your health care provider or dietitian will help you determine when it is safe to eat regular foods. What are tips for following this plan?     Reading food labels  Check food labels of nutrition shakes for the amount of protein. Look for nutrition shakes that have at least 8-10 grams of protein in each serving.  Look for drinks, such as milks and juices, that are "fortified" or "enriched." This means that vitamins and minerals have been added. Shopping  Buy premade nutritional shakes to keep on hand.  To vary your choices, buy different flavors of milks and shakes. Meal planning  Choose flavors and foods that you enjoy.  To make sure you get enough energy from food (calories): ? Eat 3 full liquid meals each day. Have a liquid snack between each meal. ? Drink 6-8 ounces (177-237 ml) of a nutrition supplement shake with meals or as snacks. ? Add protein powder, powdered milk, milk, or yogurt to shakes to increase the amount of protein.  Drink at least one serving a day of citrus fruit juice or fruit juice that has vitamin C added. General guidelines  Before starting the full-liquid diet, check with your health care provider to know what foods you should avoid. These may include full-fat or high-fiber liquids.  You may have any liquid or food that becomes a liquid at room temperature. The food is considered a liquid if it can be poured off a spoon at room temperature.  Do not drink alcohol unless approved by your health care provider.  This diet gives you most of the nutrients that you need for energy, but you may not get enough of certain vitamins, minerals, and fiber. Make sure to talk to your health care provider or dietitian about: ? How many calories you need  to eat get day. ? How much fluid you should have each day. ? Taking a multivitamin or a nutritional supplement. What foods are allowed? The items listed may not be a complete list. Talk with your dietitian about what dietary choices are best for you. Grains Thin hot cereal, such as cream of wheat. Soft-cooked pasta or rice pured in soup. Vegetables Pulp-free tomato or vegetable juice. Vegetables pured in soup. Fruits Fruit juice without pulp. Strained fruit pures (seeds and skins removed). Meats and other protein foods Beef, chicken, and fish broths. Powdered protein supplements. Dairy Milk and milk-based beverages, including milk shakes and instant breakfast mixes. Smooth yogurt. Pured cottage cheese. Beverages Water. Coffee and tea (caffeinated or decaffeinated). Cocoa. Liquid nutritional supplements. Soft drinks. Nondairy milks, such as almond, coconut, rice, or soy milk. Fats and oils Melted margarine and butter. Cream. Canola, almond, avocado, corn, grapeseed, sunflower, and sesame oils. Gravy. Sweets and desserts Custard. Pudding. Flavored gelatin. Smooth ice cream (without nuts or candy pieces). Sherbet. Popsicles. Italian ice. Pudding pops. Seasoning and other foods Salt and pepper. Spices. Cocoa powder. Vinegar. Ketchup. Yellow mustard. Smooth sauces, such as Hollandaise, cheese sauce, or white sauce. Soy sauce. Cream soups. Strained soups. Syrup. Honey. Jelly (without fruit pieces). What foods are not allowed? The items listed may not be a complete list. Talk with your dietitian about what dietary choices are best for you. Grains Whole grains. Pasta. Rice. Cold cereal. Bread. Crackers.   Vegetables All whole fresh, frozen, or canned vegetables. Fruits All whole fresh, frozen, or canned fruits. Meats and other protein foods All cuts of meat, poultry, and fish. Eggs. Tofu and soy protein. Nuts and nut butters. Lunch meat. Sausage. Dairy Hard cheese. Yogurt with fruit  chunks. Fats and oils Coconut oil. Palm oil. Lard. Cold butter. Sweets and desserts Ice cream or other frozen desserts that have any solids in them or on top, such as nuts, chocolate chips, and pieces of cookies. Cakes. Cookies. Candy. Seasoning and other foods Stone-ground mustards. Soups with chunks or pieces. Summary  A full liquid diet refers to fluids and foods that are liquid or will become liquid at room temperature.  This diet should only be used for a short period of time to help you recover from illness or surgery. Ask your health care provider or dietitian when it is safe for you to eat regular foods.  To make sure you get enough calories and nutrients, eat 3 meals each day with snacks between. Drink premade nutrition supplement shakes or add protein powder to homemade shakes. Take a vitamin and mineral supplement as told by your health care provider. This information is not intended to replace advice given to you by your health care provider. Make sure you discuss any questions you have with your health care provider. Document Revised: 10/19/2017 Document Reviewed: 09/05/2016 Elsevier Patient Education  2020 [employer]  

## 2020-06-13 NOTE — ED Notes (Signed)
Attempt to call report to floor. Will try to contact RN again.

## 2020-06-13 NOTE — Progress Notes (Signed)
Reviewed D/C instructions w pt and she verbalized understanding. All questions answered, pt D/C via w/c with all belongings. A copy of her CT scan given to her (burned on a CD), the xray was also emailed to [employer] (per pt request). And an MD note was provided to pt.

## 2020-06-13 NOTE — Discharge Summary (Addendum)
Physician Discharge Summary  Patient ID: Olivia Landry MRN: [id number] DOB/AGE: 24-02-1996 24 y.o.  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 Darius Bump, [facility] [place] An[place]  Discharge Diagnoses:  Same  Active Problems:   SBO (small bowel obstruction) (HCC)   PROCEDURES: None  Hospital Course: Chief Complaint: abd pressure HPI: The patient is a 24 year old black female who presents with abdominal pressure that is been going on for the last couple weeks.  She had one episode of vomiting about a week ago.  Otherwise she has about 8 bowel movements a day.  She has had this since her duodenal switch operation that was performed several years ago at [facility].  She denies any fevers or chills.  She came to the emergency department where a CT scan shows dilation of both the large and small intestine.  There is mesenteric edema.     She was seen in the ED by Dr. Carolynne Edouard and admitted for observation.  There was some concern that she had some level of partial obstruction vs a motility dysfunction.  She was not acutely ill and was placed on bowel rest and IV hydration.  She was seen the following a.m. by Dr. Ovidio Kin one of our bariatric surgeons.  He notes some distention of the biliopancreatic limb with a question of narrowing at the anastomosis there is also some colonic distention. He placed patient on full liquids which she has tolerated and says she feels up quite quickly. We are going to refer her back to Dr. Lambert Mody at [facility] for further evaluation and treatment.  This is not a procedure that we do at our facility.  She also has GI physicians that follow her outside of [place]. She tolerated breakfast and lunch well, we will discharge her home on some  Tylenol, and Ultram.  We recommend she stay on full liquids, she tells me she has an appointment with Dr. Lambert Mody tomorrow.  CBC Latest Ref Rng & Units 06/13/2020 06/12/2020  WBC 4.0 - 10.5 K/uL 5.9 5.9  Hemoglobin 12.0 - 15.0 g/dL 7.6(E) 8.3(T)  Hematocrit 36 - 46 % 27.0(L) 30.4(L)  Platelets 150 - 400 K/uL 282 331      CT scan on admission:1. Challenging assessment of the bowel given fairly diffuse distension, mesenteric edema, lack of intraperitoneal fat and postsurgical changes compatible with a duodenal switch procedure. 2. There is fairly significant distention of the alimentary limb with a site of mesenteric twisting in the right lower quadrant where there is some abrupt narrowing just beyond the entero-enteric suture line. Additionally, there is fairly diffuse fluid distention of the biliopancreatic limb proximal to this twisting as well. Beyond the narrowing much of the small bowel is more decompressed and normal caliber albeit with extensive fecalization of the small bowel contents which could reflect some slowed intestinal transit. 3. There is some moderate distention of the colonic segments proximally to the level of the splenic flexure where there is some irregular mural thickening possibly related to peristaltic contraction on a background of more diffuse distal colonic mural thickening of the splenic flexure through the rectosigmoid. Findings are nonspecific, but could reflect a colitis of either infectious or inflammatory etiology though may warrant further evaluation with outpatient direct visualization following resolution of acute symptoms. 4. Small volume low-attenuation free fluid in the pelvis, may be physiologic albeit  with additional features of anasarca including diffuse body wall and mesenteric edema. 5. Small amount of non organized though fairly focal fluid in the right gluteal soft tissues as well. Could correlate for a small hematoma or recent injection.    Electronically Signed: By: Kreg Shropshire M.D. On: 06/12/2020 20:25  CXR 06/12/2020 shows no active cardiopulmonary disease.  Plain abdominal film 06/13/2020: Scattered large and small bowel gas is noted.  Stable colonic distention is noted without definitive obstructive changes.  Contrast noted in the bladder from the recent CT no free air is noted no bony abnormality.  Impression: Stable colonic distention when compared to previous CT no new focal abnormality noted.   We are obtaining a disc with her studies for her to take to Dr. Lambert Mody in [place]. I will also give her a copy of this discharge summary and her CT scan results.  Disposition: Discharge disposition: 01-Home or Self Care         Allergies as of 06/13/2020       Reactions   Amoxicillin Hives   Keflex [cephalexin] Hives   Penicillins Hives   Sumatriptan Other (See Comments)   Nausea, dizzy, hot, and break out in sweats.        Medication List     TAKE these medications    acetaminophen 500 MG tablet Commonly known as: TYLENOL You can take 1000 mg every 6 hours as needed for pain.  You can buy this over the counter at any drug store.  Do not take more than 4000 mg of Tylenol per day it can harm your liver. What changed:  how much to take how to take this when to take this reasons to take this additional instructions   Adzenys XR-ODT 18.8 MG Tbed Generic drug: Amphetamine ER Take 1 tablet by mouth daily.   amphetamine-dextroamphetamine 20 MG tablet Commonly known as: ADDERALL Take 1 tablet by mouth daily.   busPIRone 10 MG tablet Commonly known as: BUSPAR Take 10 mg by mouth 2 (two) times daily.   calcium gluconate 500 MG tablet Take 1 tablet by mouth 2 (two) times daily.   ergocalciferol 1.25 MG (50000 UT) capsule Commonly known as: VITAMIN D2 Take 1 capsule by mouth daily.   Ferrous Fumarate 324 (106 Fe) MG Tabs tablet Commonly known as: HEMOCYTE - 106 mg FE Take 324 mg of iron by mouth  daily.   furosemide 20 MG tablet Commonly known as: LASIX Take 20 mg by mouth daily.   LORazepam 0.5 MG tablet Commonly known as: ATIVAN Take 1 tablet by mouth daily as needed.   losartan-hydrochlorothiazide 50-12.5 MG tablet Commonly known as: HYZAAR Take 2 tablets by mouth daily.   multivitamin tablet Take 1 tablet by mouth daily.   OXcarbazepine ER 600 MG Tb24 Take 600 mg by mouth daily.   traMADol 50 MG tablet Commonly known as: Ultram One tablet every 6 hours as needed for pain not relieved by plain Tylenol/acetaminophen.        Follow-up Information     Darius Bump Follow up.   Specialty: Surgery Why: See her for your follow up and evaluation as scheduled tomorrow. Contact information: [address] [place] [address] [phone]                 Signed: Sherrie George 06/13/2020, 2:41 PM  Agree with above.  Ovidio Kin, MD, [facility] Surgery Office phone:  [phone]

## 2020-06-13 NOTE — Progress Notes (Signed)
[facility] [place] [facility] Office:  [phone] General Surgery Progress Note   LOS: 1 day  POD -     Assessment and Plan: 1.  Abdominal pressure  Flagyl/Cipro  CT scan shows distention of small bowel, distention of biliopancreatic limb, question of narrowing at anastomosis, there is colonic distention.  Will give full liquids and if she does well, will discharge her later today. 2.  Duodenal switch at [facility] - Dr. Darius Bump - 2017  She has seen Dr. Lambert Mody at [facility] on 03/03/2020  She has seen someone for GI at [facility] in [place].  I stressed to the patient that she needs follow up with the surgeon that did her surgery (we do not do duodenal switches here) and the GI physician that she has already see. 3.  Anemia   Hgb - 8.4 - 06/13/2020 4.  DVT prophylaxis - SQ heparin   Active Problems:   SBO (small bowel obstruction) (HCC)  Subjective:  The patient says she feels distended, but has had some symptoms for weeks.  She said that he symptoms got worse last Wednesday, 11/3.  Her mother, Undra Trembath, was on the phone during my entire interview.  Objective:   Vitals:   06/13/20 0758 06/13/20 0942  BP: 138/79 110/73  Pulse:  80  Resp: 18 18  Temp: 97.8 F (36.6 C) 98.6 F (37 C)  SpO2: 94% 100%     Intake/Output from previous day:  No intake/output data recorded.  Intake/Output this shift:  No intake/output data recorded.   Physical Exam:   General: WN AA F who is alert and oriented.    HEENT: Normal. Pupils equal. .   Lungs: Clear   Abdomen: Mild distention.  No localized tenderness.  BS present.   Lab Results:    Recent Labs    06/12/20 1745 06/13/20 0626  WBC 5.9 5.9  HGB 9.7* 8.4*  HCT 30.4* 27.0*  PLT 331 282    BMET   Recent Labs    06/12/20 1745 06/13/20 0626  NA 140 138  K 3.9 3.6  CL 108 108  CO2 21* 24  GLUCOSE 116* 138*  BUN 20 18  CREATININE 0.79 0.68  CALCIUM 9.1 8.1*    PT/INR  No results for input(s): LABPROT, INR in the  last 72 hours.  ABG  No results for input(s): PHART, HCO3 in the last 72 hours.  Invalid input(s): PCO2, PO2   Studies/Results:  DG Chest 2 View  Result Date: 06/12/2020 CLINICAL DATA:  Abdominal pain and distention x2 weeks. EXAM: CHEST - 2 VIEW COMPARISON:  None. FINDINGS: The heart size and mediastinal contours are within normal limits. Both lungs are clear. The visualized skeletal structures are unremarkable. IMPRESSION: No active cardiopulmonary disease. Electronically Signed   By: Katherine Mantle M.D.   On: 06/12/2020 20:25   CT ABDOMEN PELVIS W CONTRAST  Addendum Date: 06/12/2020   ADDENDUM REPORT: 06/12/2020 20:39 ADDENDUM: These results were called by telephone at the time of interpretation on 06/12/2020 at 8:39 pm to provider Pricilla Loveless MD , who verbally acknowledged these results. Electronically Signed   By: Kreg Shropshire M.D.   On: 06/12/2020 20:39   Result Date: 06/12/2020 CLINICAL DATA:  Abdominal distension EXAM: CT ABDOMEN AND PELVIS WITH CONTRAST TECHNIQUE: Multidetector CT imaging of the abdomen and pelvis was performed using the standard protocol following bolus administration of intravenous contrast. CONTRAST:  OMNIPAQUE IOHEXOL 300 MG/ML  SOLN COMPARISON:  None FINDINGS: Lower chest: Lung bases  are clear. Normal heart size. No pericardial effusion. Hepatobiliary: No focal liver abnormality is seen. Patient is post cholecystectomy. Slight prominence of the biliary tree likely related to reservoir effect. No calcified intraductal gallstones. Pancreas: No pancreatic ductal dilatation or surrounding inflammatory changes. Spleen: Normal in size. No concerning splenic lesions. Adrenals/Urinary Tract: Normal adrenal glands. Kidneys are normally located with symmetric enhancement. No suspicious renal lesion, urolithiasis or hydronephrosis. Bladder is unremarkable. Stomach/Bowel: Challenging assessment of the bowel given fairly diffuse distension, lack of intraperitoneal fat and  postsurgical changes compatible with a duodenal switch procedure. 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 Additionally, there is fairly diffuse fluid distention of loops of the more proximal biliopancreatic limb clustered in the left upper quadrant predominantly involving the jejunal segments. Beyond the region of twisting much of the small bowel is more decompressed and normal caliber albeit with extensive fecalization of the small bowel contents which could reflect some slowed intestinal transit. There is some moderate distention of the colonic segments proximally to the level of the splenic flexure where there is some irregular mural thickening possibly related to peristaltic contraction on a background of more diffuse distal colonic mural thickening. Vascular/Lymphatic: No significant vascular findings are present. No enlarged abdominal or pelvic lymph nodes. Reproductive: Normal appearance of the uterus and adnexal structures. Collapsing corpus luteum in the right ovary is a physiologically normal finding. Other: Diffuse edematous changes of the mesentery. Small volume low-attenuation free fluid in the pelvis, nonspecific. Diffuse body wall edema. Small amount of focal fluid in the right gluteal soft tissues as well. Could correlate for a small hematoma or recent injection. Musculoskeletal: No acute osseous abnormality or suspicious osseous lesion. IMPRESSION: 1. Challenging assessment of the bowel given fairly diffuse distension, mesenteric edema, lack of intraperitoneal fat and postsurgical changes compatible with a duodenal switch procedure. 2. There is fairly significant distention of the alimentary limb with a site of mesenteric twisting in the right lower quadrant where there is some abrupt  narrowing just beyond the entero-enteric suture line. Additionally, there is fairly diffuse fluid distention of the biliopancreatic limb proximal to this twisting as well. Beyond the narrowing much of the small bowel is more decompressed and normal caliber albeit with extensive fecalization of the small bowel contents which could reflect some slowed intestinal transit. 3. There is some moderate distention of the colonic segments proximally to the level of the splenic flexure where there is some irregular mural thickening possibly related to peristaltic contraction on a background of more diffuse distal colonic mural thickening of the splenic flexure through the rectosigmoid. Findings are nonspecific, but could reflect a colitis of either infectious or inflammatory etiology though may warrant further evaluation with outpatient direct visualization following resolution of acute symptoms. 4. Small volume low-attenuation free fluid in the pelvis, may be physiologic albeit with additional features of anasarca including diffuse body wall and mesenteric edema. 5. Small amount of non organized though fairly focal fluid in the right gluteal soft tissues as well. Could correlate for a small hematoma or recent injection. Electronically Signed: By: Kreg Shropshire M.D. On: 06/12/2020 20:25   DG Abd Portable 1V  Result Date: 06/13/2020 CLINICAL DATA:  Abdominal distension for several months EXAM: PORTABLE ABDOMEN - 1 VIEW COMPARISON:  06/12/2020 FINDINGS: Scattered large and small bowel gas is noted. Stable colonic  distension is noted without definitive obstructive change. Contrast is noted within the bladder from the recent CT. No free air is noted. No bony abnormality is seen. IMPRESSION: Stable colonic distension when compared with the previous CT. No new focal abnormality is noted. Electronically Signed   By: Alcide Clever M.D.   On: 06/13/2020 08:38     Anti-infectives:   Anti-infectives (From admission, onward)    Start     Dose/Rate Route Frequency Ordered Stop   06/13/20 0004  ciprofloxacin (CIPRO) IVPB 400 mg        400 mg 200 mL/hr over 60 Minutes Intravenous Every 12 hours 06/13/20 0004     06/13/20 0004  metroNIDAZOLE (FLAGYL) IVPB 500 mg        500 mg 100 mL/hr over 60 Minutes Intravenous Every 8 hours 06/13/20 0004        Ovidio Kin, MD, [facility] Surgery Office: [phone] 06/13/2020

## 2020-06-13 NOTE — ED Provider Notes (Signed)
Patient seen as a transfer from [facility] to facilitate examination and likely admission by general surgery.  Patient complains of moderate level of pain upon my interview.   Physical Exam Vitals and nursing note reviewed.  Constitutional:      General: She is not in acute distress.    Appearance: She is well-developed. She is not diaphoretic.  HENT:     Head: Normocephalic and atraumatic.  Eyes:     Conjunctiva/sclera: Conjunctivae normal.  Cardiovascular:     Rate and Rhythm: Normal rate and regular rhythm.  Pulmonary:     Effort: Pulmonary effort is normal.  Abdominal:     Tenderness: There is generalized abdominal tenderness.  Musculoskeletal:     Cervical back: Neck supple.  Skin:    General: Skin is warm and dry.     Coloration: Skin is not pale.  Neurological:     Mental Status: She is alert.  Psychiatric:        Behavior: Behavior normal.     Abnormal Labs Reviewed  COMPREHENSIVE METABOLIC PANEL - Abnormal; Notable for the following components:      Result Value   CO2 21 (*)    Glucose, Bld 116 (*)    AST 62 (*)    All other components within normal limits  CBC - Abnormal; Notable for the following components:   RBC 3.17 (*)    Hemoglobin 9.7 (*)    HCT 30.4 (*)    All other components within normal limits    DG Chest 2 View  Result Date: 06/12/2020 CLINICAL DATA:  Abdominal pain and distention x2 weeks. EXAM: CHEST - 2 VIEW COMPARISON:  None. FINDINGS: The heart size and mediastinal contours are within normal limits. Both lungs are clear. The visualized skeletal structures are unremarkable. IMPRESSION: No active cardiopulmonary disease. Electronically Signed   By: Katherine Mantle M.D.   On: 06/12/2020 20:25   CT ABDOMEN PELVIS W CONTRAST  Addendum Date: 06/12/2020   ADDENDUM REPORT: 06/12/2020 20:39 ADDENDUM: These results were called by telephone at the time of interpretation on 06/12/2020 at 8:39 pm to provider Pricilla Loveless MD , who  verbally acknowledged these results. Electronically Signed   By: Kreg Shropshire M.D.   On: 06/12/2020 20:39   Result Date: 06/12/2020 CLINICAL DATA:  Abdominal distension EXAM: CT ABDOMEN AND PELVIS WITH CONTRAST TECHNIQUE: Multidetector CT imaging of the abdomen and pelvis was performed using the standard protocol following bolus administration of intravenous contrast. CONTRAST:  OMNIPAQUE IOHEXOL 300 MG/ML  SOLN COMPARISON:  None FINDINGS: Lower chest: Lung bases are clear. Normal heart size. No pericardial effusion. Hepatobiliary: No focal liver abnormality is seen. Patient is post cholecystectomy. Slight prominence of the biliary tree likely related to reservoir effect. No calcified intraductal gallstones. Pancreas: No pancreatic ductal dilatation or surrounding inflammatory changes. Spleen: Normal in size. No concerning splenic lesions. Adrenals/Urinary Tract: Normal adrenal glands. Kidneys are normally located with symmetric enhancement. No suspicious renal lesion, urolithiasis or hydronephrosis. Bladder is unremarkable. Stomach/Bowel: Challenging assessment of the bowel given fairly diffuse distension, lack of intraperitoneal fat and postsurgical changes compatible with a duodenal switch procedure. 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 Additionally, there is fairly diffuse fluid distention of loops of the more proximal biliopancreatic limb clustered in the left upper quadrant predominantly involving the  jejunal segments. Beyond the region of twisting much of the small bowel is more decompressed and normal caliber albeit with extensive fecalization of the small bowel contents which could reflect some slowed intestinal transit. There is some moderate distention of the colonic segments  proximally to the level of the splenic flexure where there is some irregular mural thickening possibly related to peristaltic contraction on a background of more diffuse distal colonic mural thickening. Vascular/Lymphatic: No significant vascular findings are present. No enlarged abdominal or pelvic lymph nodes. Reproductive: Normal appearance of the uterus and adnexal structures. Collapsing corpus luteum in the right ovary is a physiologically normal finding. Other: Diffuse edematous changes of the mesentery. Small volume low-attenuation free fluid in the pelvis, nonspecific. Diffuse body wall edema. Small amount of focal fluid in the right gluteal soft tissues as well. Could correlate for a small hematoma or recent injection. Musculoskeletal: No acute osseous abnormality or suspicious osseous lesion. IMPRESSION: 1. Challenging assessment of the bowel given fairly diffuse distension, mesenteric edema, lack of intraperitoneal fat and postsurgical changes compatible with a duodenal switch procedure. 2. There is fairly significant distention of the alimentary limb with a site of mesenteric twisting in the right lower quadrant where there is some abrupt narrowing just beyond the entero-enteric suture line. Additionally, there is fairly diffuse fluid distention of the biliopancreatic limb proximal to this twisting as well. Beyond the narrowing much of the small bowel is more decompressed and normal caliber albeit with extensive fecalization of the small bowel contents which could reflect some slowed intestinal transit. 3. There is some moderate distention of the colonic segments proximally to the level of the splenic flexure where there is some irregular mural thickening possibly related to peristaltic contraction on a background of more diffuse distal colonic mural thickening of the splenic flexure through the rectosigmoid. Findings are nonspecific, but could reflect a colitis of either infectious or inflammatory  etiology though may warrant further evaluation with outpatient direct visualization following resolution of acute symptoms. 4. Small volume low-attenuation free fluid in the pelvis, may be physiologic albeit with additional features of anasarca including diffuse body wall and mesenteric edema. 5. Small amount of non organized though fairly focal fluid in the right gluteal soft tissues as well. Could correlate for a small hematoma or recent injection. Electronically Signed: By: Kreg Shropshire M.D. On: 06/12/2020 20:25    Clinical Course as of Jun 13 54  Wynelle Link Jun 12, 2020  2225 Spoke with Dr. Carolynne Edouard, general surgery. Notified him of patient's arrival at [facility] ED. He states he plans on coming to see her.    [SJ]    Clinical Course User Index [SJ] Anselm Pancoast, [place]    Vitals:   06/12/20 2100 06/12/20 2143 06/13/20 0015 06/13/20 0030  BP: 132/89 127/83 124/72 122/81  Pulse: 99 100 92 76  Resp: (!) 21 (!) 24  20  Temp:      TempSrc:      SpO2: 94% 96% 100% 100%  Weight:      Height:          Anselm Pancoast, PA-C 06/13/20 0056    Rolan Bucco, MD 06/16/20 [phone]

## 2020-06-16 LAB — ZINC: Zinc: 59 ug/dL (ref 44–115)

## 2020-06-17 LAB — VITAMIN B1: Vitamin B1 (Thiamine): 159.9 nmol/L (ref 66.5–200.0)

## 2020-06-21 ENCOUNTER — Other Ambulatory Visit: Payer: Self-pay | Admitting: Physician Assistant

## 2020-06-21 ENCOUNTER — Other Ambulatory Visit (HOSPITAL_COMMUNITY): Payer: Self-pay | Admitting: Physician Assistant

## 2020-06-21 DIAGNOSIS — R1033 Periumbilical pain: Secondary | ICD-10-CM

## 2020-06-22 ENCOUNTER — Ambulatory Visit (HOSPITAL_COMMUNITY)
Admission: RE | Admit: 2020-06-22 | Discharge: 2020-06-22 | Disposition: A | Discharge status: Home / Self Care | Payer: BC Managed Care – PPO | Source: Ambulatory Visit | Attending: Physician Assistant | Admitting: Physician Assistant

## 2020-06-22 ENCOUNTER — Other Ambulatory Visit: Payer: Self-pay

## 2020-06-22 DIAGNOSIS — R1033 Periumbilical pain: Secondary | ICD-10-CM | POA: Diagnosis present

## 2020-06-22 MED ORDER — IOHEXOL 300 MG/ML  SOLN
100.0000 mL | Freq: Once | INTRAMUSCULAR | Status: AC | PRN
  Administered 2020-06-22: 100 mL via INTRAVENOUS

## 2020-12-09 ENCOUNTER — Other Ambulatory Visit: Payer: Self-pay | Admitting: *Deleted

## 2020-12-09 DIAGNOSIS — I890 Lymphedema, not elsewhere classified: Secondary | ICD-10-CM

## 2020-12-29 DIAGNOSIS — K912 Postsurgical malabsorption, not elsewhere classified: Secondary | ICD-10-CM | POA: Insufficient documentation

## 2020-12-29 DIAGNOSIS — L987 Excessive and redundant skin and subcutaneous tissue: Secondary | ICD-10-CM | POA: Insufficient documentation

## 2020-12-29 DIAGNOSIS — Z9884 Bariatric surgery status: Secondary | ICD-10-CM | POA: Insufficient documentation

## 2020-12-29 DIAGNOSIS — G43109 Migraine with aura, not intractable, without status migrainosus: Secondary | ICD-10-CM | POA: Insufficient documentation

## 2020-12-29 DIAGNOSIS — F419 Anxiety disorder, unspecified: Secondary | ICD-10-CM | POA: Insufficient documentation

## 2020-12-30 ENCOUNTER — Ambulatory Visit (HOSPITAL_COMMUNITY)
Admission: RE | Admit: 2020-12-30 | Discharge: 2020-12-30 | Disposition: A | Discharge status: Home / Self Care | Payer: BC Managed Care – PPO | Source: Ambulatory Visit | Attending: Vascular Surgery | Admitting: Vascular Surgery

## 2020-12-30 ENCOUNTER — Encounter: Payer: Self-pay | Admitting: Physician Assistant

## 2020-12-30 ENCOUNTER — Other Ambulatory Visit: Payer: Self-pay

## 2020-12-30 ENCOUNTER — Ambulatory Visit (INDEPENDENT_AMBULATORY_CARE_PROVIDER_SITE_OTHER): Payer: BC Managed Care – PPO | Admitting: Physician Assistant

## 2020-12-30 VITALS — BP 110/74 | HR 100 | Temp 98.2°F | Ht 63.0 in | Wt 144.9 lb

## 2020-12-30 DIAGNOSIS — I890 Lymphedema, not elsewhere classified: Secondary | ICD-10-CM | POA: Diagnosis present

## 2020-12-30 DIAGNOSIS — I872 Venous insufficiency (chronic) (peripheral): Secondary | ICD-10-CM

## 2020-12-30 NOTE — Progress Notes (Signed)
[facility] [place]     History of Present Illness  Olivia Landry is a 25 y.o. female who presents with chief complaint: bilateral right > left  swollen legs.  Patient notes, onset of swelling in 2020  associated with no cause.  She states she can feel it starting and then her legs stay swollen for 2-3 weeks and then it goes away.  The patient has had no history of DVT, no history of varicose vein, no history of venous stasis ulcers, no history of  Lymphedema and no history of skin changes in lower legs.  There is a family history of sever lymph disorder to include her father and grandmother.  The patient has not used compression stockings in the past.   Past medical history: Bariatric surgery for weight loss, bipolar, depression, DM, hyperlipidemia, and HTN.      Past Medical History:  Diagnosis Date  . Anxiety   . Diabetes mellitus without complication (HCC)   . Hypertension     Past Surgical History:  Procedure Laterality Date  . ABDOMINAL SURGERY    . 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 Marital status: Single    Spouse name: Not on file  . Number of children: Not on file  . Years of education: Not on file  . Highest education level: Not on file  Occupational History  . Not on file  Tobacco Use  . Smoking status: Never Smoker  . Smokeless tobacco: Never Used  Vaping Use  . Vaping Use: Never used  Substance and Sexual Activity  . Alcohol use: Not Currently  . Drug use: Yes    Types: Marijuana  . Sexual activity: Not on file  Other Topics Concern  . Not on file  Social History Narrative  . Not on file   Social Determinants of Health   Financial Resource Strain: Not on file  Food Insecurity: Not on file  Transportation Needs: Not on file  Physical Activity: Not on file  Stress: Not on file  Social Connections: Not on file  Intimate Partner Violence: Not  on file    No family history on file.  Current Outpatient Medications on File Prior to Visit  Medication Sig Dispense Refill  . Ferrous Fumarate (HEMOCYTE - 106 MG FE) 324 (106 Fe) MG TABS tablet Take 324 mg of iron by mouth daily.    Marland Kitchen acetaminophen (TYLENOL) 500 MG tablet You can take 1000 mg every 6 hours as needed for pain.  You can buy this over the counter at any drug store.  Do not take more than 4000 mg of Tylenol per day it can harm your liver. (Patient not taking: Reported on 12/30/2020) 30 tablet 0  . Amphetamine ER (ADZENYS XR-ODT) 18.8 MG TBED Take 1 tablet by mouth daily.    Marland Kitchen amphetamine-dextroamphetamine (ADDERALL) 20 MG tablet Take 1 tablet by mouth daily.     . busPIRone (BUSPAR) 10 MG tablet Take 10 mg by mouth 2 (two) times daily.  (Patient not taking: Reported on 12/30/2020)    . calcium gluconate 500 MG tablet Take 1 tablet by mouth 2 (two) times daily.    . Cholecalciferol (VITAMIN D3) 125 MCG (5000 UT) TABS 1 tablet    . ergocalciferol (VITAMIN D2) 1.25 MG (50000 UT) capsule Take 1 capsule by mouth daily. (Patient not taking: Reported on 12/30/2020)    . furosemide (LASIX) 20 MG tablet  Take 20 mg by mouth daily.    Marland Kitchen LORazepam (ATIVAN) 0.5 MG tablet Take 1 tablet by mouth daily as needed.    Marland Kitchen losartan-hydrochlorothiazide (HYZAAR) 50-12.5 MG tablet Take 2 tablets by mouth daily.    . Multiple Vitamin (MULTIVITAMIN) tablet Take 1 tablet by mouth daily.    . OXcarbazepine ER 600 MG TB24 Take 600 mg by mouth daily.    . traMADol (ULTRAM) 50 MG tablet One tablet every 6 hours as needed for pain not relieved by plain Tylenol/acetaminophen. (Patient not taking: Reported on 12/30/2020) 8 tablet 0   No current facility-administered medications on file prior to visit.    Allergies as of 12/30/2020 - Review Complete 12/30/2020  Allergen Reaction Noted  . Amoxicillin Hives 06/12/2020  . Keflex [cephalexin] Hives 06/12/2020  . Metformin  11/18/2018  . Penicillins Hives  06/12/2020  . Sumatriptan Other (See Comments) 06/12/2020     ROS:   General:  No weight loss, Fever, chills  HEENT: No recent headaches, no nasal bleeding, no visual changes, no sore throat  Neurologic: No dizziness, blackouts, seizures. No recent symptoms of stroke or mini- stroke. No recent episodes of slurred speech, or temporary blindness.  Cardiac: No recent episodes of chest pain/pressure, no shortness of breath at rest.  No shortness of breath with exertion.  Denies history of atrial fibrillation or irregular heartbeat  Vascular: No history of rest pain in feet.  No history of claudication.  No history of non-healing ulcer, No history of DVT   Pulmonary: No home oxygen, no productive cough, no hemoptysis,  No asthma or wheezing  Musculoskeletal:  [ ]  Arthritis, [ ]  Low back pain,  [ ]  Joint pain  Hematologic:No history of hypercoagulable state.  No history of easy bleeding.  No history of anemia  Gastrointestinal: No hematochezia or melena,  No gastroesophageal reflux, no trouble swallowing  Urinary: [ ]  chronic Kidney disease, [ ]  on HD - [ ]  MWF or [ ]  TTHS, [ ]  Burning with urination, [ ]  Frequent urination, [ ]  Difficulty urinating;   Skin: No rashes  Psychological: No history of anxiety,  No history of depression  Physical Examination  Vitals:   12/30/20 1514  BP: 110/74  Pulse: 100  Temp: 98.2 F (36.8 C)  TempSrc: Skin  SpO2: 100%  Weight: 144 lb 14.4 oz (65.7 kg)  Height: 5\' 3"  (1.6 m)    Body mass index is 25.67 kg/m.  General:  Alert and oriented, no acute distress HEENT: Normal Neck: No bruit or JVD Pulmonary: Clear to auscultation bilaterally Cardiac: Regular Rate and Rhythm without murmur Abdomen: Soft, non-tender, non-distended, no mass, no scars Skin: No rash Extremity Pulses:  2+ radial, brachial, femoral, dorsalis pedis, posterior tibial pulses bilaterally Musculoskeletal: No deformity or edema today on exam  Neurologic: Upper and  lower extremity motor 5/5 and symmetric  DATA:   Venous Reflux Times  +--------------+---------+------+-----------+------------+--------+  RIGHT     Reflux NoRefluxReflux TimeDiameter cmsComments               Yes                   +--------------+---------+------+-----------+------------+--------+  CFV            yes  >1 second             +--------------+---------+------+-----------+------------+--------+  FV prox    no                         +--------------+---------+------+-----------+------------+--------+  FV mid    no                         +--------------+---------+------+-----------+------------+--------+  FV dist    no                         +--------------+---------+------+-----------+------------+--------+  Popliteal   no                         +--------------+---------+------+-----------+------------+--------+  GSV at [facility]  no               0.471        +--------------+---------+------+-----------+------------+--------+  GSV prox thighno               0.307        +--------------+---------+------+-----------+------------+--------+  GSV mid thigh no               0.438        +--------------+---------+------+-----------+------------+--------+  GSV dist thighno               0.310        +--------------+---------+------+-----------+------------+--------+  GSV at knee  no               0.328        +--------------+---------+------+-----------+------------+--------+  GSV prox calf                 0.228        +--------------+---------+------+-----------+------------+--------+  GSV mid calf                 0.197         +--------------+---------+------+-----------+------------+--------+  SSV Pop Fossa no               0.174        +--------------+---------+------+-----------+------------+--------+  SSV prox calf no               0.160        +--------------+---------+------+-----------+------------+--------+  SSV mid calf                 0.151        +--------------+---------+------+-----------+------------+--------+     +--------------+---------+------+-----------+------------+--------+  LEFT     Reflux NoRefluxReflux TimeDiameter cmsComments               Yes                   +--------------+---------+------+-----------+------------+--------+  CFV            yes  >1 second             +--------------+---------+------+-----------+------------+--------+  FV prox    no                         +--------------+---------+------+-----------+------------+--------+  FV mid    no                         +--------------+---------+------+-----------+------------+--------+  FV dist    no                         +--------------+---------+------+-----------+------------+--------+  Popliteal   no                         +--------------+---------+------+-----------+------------+--------+  GSV at SFJ        yes  >500 ms   0.724        +--------------+---------+------+-----------+------------+--------+  GSV prox thighno               0.523        +--------------+---------+------+-----------+------------+--------+  GSV mid thigh no               0.547        +--------------+---------+------+-----------+------------+--------+  GSV dist thighno                0.523        +--------------+---------+------+-----------+------------+--------+  GSV at knee  no               0.444        +--------------+---------+------+-----------+------------+--------+  GSV prox calf                 0.340        +--------------+---------+------+-----------+------------+--------+  GSV mid calf                 0.119        +--------------+---------+------+-----------+------------+--------+  SSV Pop Fossa no               0.192        +--------------+---------+------+-----------+------------+--------+  SSV prox calf no               0.205        +--------------+---------+------+-----------+------------+--------+  SSV mid calf                 0.174        +--------------+---------+------+-----------+------------+--------+     Summary:  Bilateral:  - No evidence of deep vein thrombosis seen in the lower extremities,  bilaterally, from the common femoral through the popliteal veins.  - No evidence of superficial venous thrombosis in the lower extremities,  bilaterally.    Right:  - No evidence of superficial venous reflux seen in the right greater  saphenous vein.  - No evidence of superficial venous reflux seen in the right short  saphenous vein.  - Venous reflux is noted in the right common femoral vein.    Left:  - Venous reflux is noted in the left common femoral vein.  - Venous reflux is noted in the left sapheno-femoral junction.    Assessment/Plan: Lower extremity edema  B CFV reflux and left SFJ reflux without GSV reflux bilaterally. She has palpable pedal pulses B and is not at risk of limb loss.  She has no edema today on exam.     I will place her in mild compression 15-20 mm hg she will get both knee high and thigh high compression.  If she develops weeping, dorsal foot  swelling, non healing wounds she will call in the future as needed.   F/U PRN    Mosetta PigeonEmma Maureen Amiya Escamilla PA-C [facility] [place] Office: [phone]

## 2021-01-30 ENCOUNTER — Telehealth: Payer: Self-pay

## 2021-01-30 NOTE — Telephone Encounter (Signed)
Patient calls today to report that over the past week her legs have swollen again and are heavy and painful. Says she pressed her finger into her leg "and the dent stayed so long it changed colors." She was seen in the office at the end of May with venous duplex. Says she is usually wearing compression socks and elevating her legs.Placed on vein day with PA for follow up.

## 2021-02-01 ENCOUNTER — Other Ambulatory Visit: Payer: Self-pay

## 2021-02-01 ENCOUNTER — Ambulatory Visit (INDEPENDENT_AMBULATORY_CARE_PROVIDER_SITE_OTHER): Payer: BC Managed Care – PPO | Admitting: Physician Assistant

## 2021-02-01 VITALS — BP 117/78 | HR 86 | Temp 98.2°F | Resp 20 | Ht 63.0 in | Wt 148.1 lb

## 2021-02-01 DIAGNOSIS — M7989 Other specified soft tissue disorders: Secondary | ICD-10-CM

## 2021-02-01 NOTE — Progress Notes (Signed)
[facility]           [place]  History and Physical   Olivia Landry is a 25 y.o. female who was seen in our office a month ago with BLE swelling with right > left.  She had swelling that started in 2020 with no significant reason.  She has no hx of DVT, VV or venous stasis ulcers or lymphedema or skin changes.  She does have family hx of lymph disorder with her father and GM.  She had never used compression.    She does have hx of bariatric surgery, bipolar/anxiety, depression, DM, hyperlipidemia, hypothyroidism and HTN.  She states that she was about 250lbs prior to her bariatric surgery.   The pt returns today with c/o continued leg swelling.  She states that her PCP had her on losartan/HCTZ and she was getting leg cramps that she thought was due to low potassium so she stopped taking her medication.  Her swelling did get worse with this.  She did show me a video of her swollen thighs and her lower legs where she had pitting edema.  This is much improved at today's visit.  She states that she has since started back on her medications and she still has swelling but just not as bad as before.  She states that wearing the thigh high compression works better than the knee high compression.  Leg elevation also helps with her swelling.  She states that she has cut back on the salt in her diet.  She has not seen a cardiologist.  She wants to cover all her basis for why her legs are swelling, hence her visit today.    Her PCP is [facility] on [facility].   The pt is not on a statin for cholesterol management.  The pt is not on a daily aspirin.   Other AC:  none The pt is on ARB for hypertension.   The pt is diabetic.   Tobacco hx:  never   Past Medical History:  Diagnosis Date   Anxiety    Diabetes mellitus without complication (HCC)    Hypertension     Past Surgical History:  Procedure Laterality Date   ABDOMINAL SURGERY     LAPAROSCOPIC  GASTRIC RESTRICTIVE DUODENAL PROCEDURE (DUODENAL SWITCH)      Social History   Socioeconomic History   Marital status: Single    Spouse name: Not on file   Number of children: Not on file   Years of education: Not on file   Highest education level: Not on file  Occupational History   Not on file  Tobacco Use   Smoking status: Never   Smokeless tobacco: Never  Vaping Use   Vaping Use: Never used  Substance and Sexual Activity   Alcohol use: Not Currently   Drug use: Yes    Types: Marijuana   Sexual activity: Not on file  Other Topics Concern   Not on file  Social History Narrative   Not on file   Social Determinants of Health   Financial Resource Strain: Not on file  Food Insecurity: Not on file  Transportation Needs: Not on file  Physical Activity: Not on file  Stress: Not on file  Social Connections: Not on file  Intimate Partner Violence: Not on file    Family History: Lymphedema with father and grandmother  Current Outpatient Medications  Medication Sig Dispense Refill   acetaminophen (  TYLENOL) 500 MG tablet You can take 1000 mg every 6 hours as needed for pain.  You can buy this over the counter at any drug store.  Do not take more than 4000 mg of Tylenol per day it can harm your liver. (Patient not taking: Reported on 12/30/2020) 30 tablet 0   Amphetamine ER (ADZENYS XR-ODT) 18.8 MG TBED Take 1 tablet by mouth daily.     amphetamine-dextroamphetamine (ADDERALL) 20 MG tablet Take 1 tablet by mouth daily.      busPIRone (BUSPAR) 10 MG tablet Take 10 mg by mouth 2 (two) times daily.  (Patient not taking: Reported on 12/30/2020)     calcium gluconate 500 MG tablet Take 1 tablet by mouth 2 (two) times daily.     Cholecalciferol (VITAMIN D3) 125 MCG (5000 UT) TABS 1 tablet     ergocalciferol (VITAMIN D2) 1.25 MG (50000 UT) capsule Take 1 capsule by mouth daily. (Patient not taking: Reported on 12/30/2020)     Ferrous Fumarate (HEMOCYTE - 106 MG FE) 324 (106 Fe) MG TABS  tablet Take 324 mg of iron by mouth daily.     furosemide (LASIX) 20 MG tablet Take 20 mg by mouth daily.     LORazepam (ATIVAN) 0.5 MG tablet Take 1 tablet by mouth daily as needed.     losartan-hydrochlorothiazide (HYZAAR) 50-12.5 MG tablet Take 2 tablets by mouth daily.     Multiple Vitamin (MULTIVITAMIN) tablet Take 1 tablet by mouth daily.     OXcarbazepine ER 600 MG TB24 Take 600 mg by mouth daily.     traMADol (ULTRAM) 50 MG tablet One tablet every 6 hours as needed for pain not relieved by plain Tylenol/acetaminophen. (Patient not taking: Reported on 12/30/2020) 8 tablet 0   No current facility-administered medications for this visit.    Allergies  Allergen Reactions   Amoxicillin Hives   Keflex [Cephalexin] Hives   Metformin     Other reaction(s): nausea and vomiting   Penicillins Hives   Sumatriptan Other (See Comments)    Nausea, dizzy, hot, and break out in sweats.    REVIEW OF SYSTEMS:   [X]  denotes positive finding, [ ]  denotes negative finding Cardiac  Comments:  Chest pain or chest pressure:    Shortness of breath upon exertion:    Short of breath when lying flat:    Irregular heart rhythm:        Vascular    Pain in calf, thigh, or hip brought on by ambulation:    Pain in feet at night that wakes you up from your sleep:     Blood clot in your veins:    Leg swelling:  x       Pulmonary    Oxygen at home:    Productive cough:     Wheezing:         Neurologic    Sudden weakness in arms or legs:     Sudden numbness in arms or legs:     Sudden onset of difficulty speaking or slurred speech:    Temporary loss of vision in one eye:     Problems with dizziness:         Gastrointestinal    GERD x       Genitourinary    Burning when urinating:     Blood in urine:        Psychiatric    Bipolar/anxiety/ADHD x       Hematologic    Bleeding problems:  Problems with blood clotting too easily:        Skin    Rashes or ulcers:        Constitutional     Fever or chills:      PHYSICAL EXAMINATION:  Today's Vitals   02/01/21 0930  BP: 117/78  Pulse: 86  Resp: 20  Temp: 98.2 F (36.8 C)  TempSrc: Temporal  SpO2: 100%  Weight: 148 lb 1.6 oz (67.2 kg)  Height:  (1.6 m)  PainSc: 6   PainLoc: Leg   Body mass index is 26.23 kg/m.   General:  WDWN in NAD; vital signs documented above Gait: normal HENT: WNL, normocephalic Pulmonary: normal non-labored breathing without wheezing Cardiac: regular HR; without carotid bruits Skin: without rashes Vascular Exam/Pulses:  Right Left  Radial 2+ (normal) 2+ (normal)  DP 2+ (normal) 2+ (normal)   Extremities: mild BLE swelling with mild pitting from her sock line.    Neurologic: A&O X 3;  moving all extremities equally Psychiatric:  The pt has Normal affect.   Non-Invasive Vascular Imaging:   Venous duplex on 12/30/2020: +--------------+---------+------+-----------+------------+--------+  RIGHT         Reflux NoRefluxReflux TimeDiameter cmsComments                          Yes                                   +--------------+---------+------+-----------+------------+--------+  CFV                     yes   >1 second                       +--------------+---------+------+-----------+------------+--------+  FV prox       no                                              +--------------+---------+------+-----------+------------+--------+  FV mid        no                                              +--------------+---------+------+-----------+------------+--------+  FV dist       no                                              +--------------+---------+------+-----------+------------+--------+  Popliteal     no                                              +--------------+---------+------+-----------+------------+--------+  GSV at SFJ    no                           0.471               +--------------+---------+------+-----------+------------+--------+  GSV prox thighno  0.307              +--------------+---------+------+-----------+------------+--------+  GSV mid thigh no                           0.438              +--------------+---------+------+-----------+------------+--------+  GSV dist thighno                           0.310              +--------------+---------+------+-----------+------------+--------+  GSV at knee   no                           0.328              +--------------+---------+------+-----------+------------+--------+  GSV prox calf                              0.228              +--------------+---------+------+-----------+------------+--------+  GSV mid calf                               0.197              +--------------+---------+------+-----------+------------+--------+  SSV Pop Fossa no                           0.174              +--------------+---------+------+-----------+------------+--------+  SSV prox calf no                           0.160              +--------------+---------+------+-----------+------------+--------+  SSV mid calf                               0.151              +--------------+---------+------+-----------+------------+--------+      +--------------+---------+------+-----------+------------+--------+  LEFT          Reflux NoRefluxReflux TimeDiameter cmsComments                          Yes                                   +--------------+---------+------+-----------+------------+--------+  CFV                     yes   >1 second                       +--------------+---------+------+-----------+------------+--------+  FV prox       no                                              +--------------+---------+------+-----------+------------+--------+  FV mid        no                                               +--------------+---------+------+-----------+------------+--------+  FV dist       no                                              +--------------+---------+------+-----------+------------+--------+  Popliteal     no                                              +--------------+---------+------+-----------+------------+--------+  GSV at SFJ              yes    >500 ms     0.724              +--------------+---------+------+-----------+------------+--------+  GSV prox thighno                           0.523              +--------------+---------+------+-----------+------------+--------+  GSV mid thigh no                           0.547              +--------------+---------+------+-----------+------------+--------+  GSV dist thighno                           0.523              +--------------+---------+------+-----------+------------+--------+  GSV at knee   no                           0.444              +--------------+---------+------+-----------+------------+--------+  GSV prox calf                              0.340              +--------------+---------+------+-----------+------------+--------+  GSV mid calf                               0.119              +--------------+---------+------+-----------+------------+--------+  SSV Pop Fossa no                           0.192              +--------------+---------+------+-----------+------------+--------+  SSV prox calf no                           0.205              +--------------+---------+------+-----------+------------+--------+  SSV mid calf                               0.174              +--------------+---------+------+-----------+------------+--------+   Summary:  Bilateral:  - No evidence of deep vein thrombosis seen in the lower extremities,  bilaterally, from the common femoral through the popliteal veins.  -  No evidence of  superficial venous thrombosis in the lower extremities,  bilaterally.     Right:  - No evidence of superficial venous reflux seen in the right greater  saphenous vein.  - No evidence of superficial venous reflux seen in the right short  saphenous vein.  - Venous reflux is noted in the right common femoral vein.     Left:  - Venous reflux is noted in the left common femoral vein.  - Venous reflux is noted in the left sapheno-femoral junction.     Olivia Landry is a 25 y.o. female who was seen in our office a month ago with BLE swelling with right > left who presents with continued BLE swelling.  -pt has easily palpable DP pulses -she did have a duplex a month a go and the pt did not have evidence of DVT. She did have venous reflux in the right CFV and the left CFV and SFJ. She is not a candidate for laser ablation.  -discussed with pt about continuing to wear thigh high 20-30 mmHg compression stockings and to put them on in the morning before her feet hit the ground so that they are easier to put on and take off at night.   -she continues to have leg swelling but it was worse off of her diuretic medication that she has since started back.  Discussed with her that there are many different reasons for leg swelling and she does have some venous reflux in the deep system.  Discussed with her to f/u with her PCP for further evaluation.   -discussed the importance of leg elevation and how to elevate properly - pt is advised to elevate their legs and a diagram is given to them to demonstrate to lay flat on their back with knees elevated and slightly bent with their feet higher than her knees, which puts their feet higher than their heart for 15 minutes per day.  If they cannot lay flat, advised to lay as flat as possible.  -pt is advised to continue as much walking as possible and avoid sitting or standing for long periods of time.  -discussed importance of weight loss and exercise and that water  aerobics would also be beneficial.  -pt will f/u as needed   Doreatha Massed, [facility] 02/01/2021 8:25 AM  Clinic MD:  Edilia Bo

## 2021-02-13 ENCOUNTER — Encounter (HOSPITAL_BASED_OUTPATIENT_CLINIC_OR_DEPARTMENT_OTHER): Payer: Self-pay

## 2021-02-13 ENCOUNTER — Other Ambulatory Visit: Payer: Self-pay

## 2021-02-13 ENCOUNTER — Emergency Department (HOSPITAL_BASED_OUTPATIENT_CLINIC_OR_DEPARTMENT_OTHER)
Admission: EM | Admit: 2021-02-13 | Discharge: 2021-02-14 | Disposition: A | Discharge status: Home / Self Care | Payer: BC Managed Care – PPO | Attending: Emergency Medicine | Admitting: Emergency Medicine

## 2021-02-13 DIAGNOSIS — I10 Essential (primary) hypertension: Secondary | ICD-10-CM | POA: Diagnosis not present

## 2021-02-13 DIAGNOSIS — Z79899 Other long term (current) drug therapy: Secondary | ICD-10-CM | POA: Diagnosis not present

## 2021-02-13 DIAGNOSIS — R1032 Left lower quadrant pain: Secondary | ICD-10-CM | POA: Insufficient documentation

## 2021-02-13 DIAGNOSIS — E119 Type 2 diabetes mellitus without complications: Secondary | ICD-10-CM | POA: Insufficient documentation

## 2021-02-13 DIAGNOSIS — E039 Hypothyroidism, unspecified: Secondary | ICD-10-CM | POA: Diagnosis not present

## 2021-02-13 DIAGNOSIS — R1012 Left upper quadrant pain: Secondary | ICD-10-CM

## 2021-02-13 LAB — COMPREHENSIVE METABOLIC PANEL
ALT: 13 U/L (ref 0–44)
AST: 13 U/L — ABNORMAL LOW (ref 15–41)
Albumin: 4.4 g/dL (ref 3.5–5.0)
Alkaline Phosphatase: 86 U/L (ref 38–126)
Anion gap: 11 (ref 5–15)
BUN: 25 mg/dL — ABNORMAL HIGH (ref 6–20)
CO2: 25 mmol/L (ref 22–32)
Calcium: 9.4 mg/dL (ref 8.9–10.3)
Chloride: 100 mmol/L (ref 98–111)
Creatinine, Ser: 1 mg/dL (ref 0.44–1.00)
GFR, Estimated: >60 mL/min (ref >60)
Glucose, Bld: 91 mg/dL (ref 70–99)
Potassium: 3.8 mmol/L (ref 3.5–5.1)
Sodium: 136 mmol/L (ref 135–145)
Total Bilirubin: 0.7 mg/dL (ref 0.3–1.2)
Total Protein: 6.8 g/dL (ref 6.5–8.1)

## 2021-02-13 LAB — URINALYSIS, ROUTINE W REFLEX MICROSCOPIC
Bilirubin Urine: NEGATIVE
Glucose, UA: NEGATIVE mg/dL
Hgb urine dipstick: NEGATIVE
Leukocytes,Ua: NEGATIVE
Nitrite: NEGATIVE
Protein, ur: NEGATIVE mg/dL
Specific Gravity, Urine: 1.02 (ref 1.005–1.030)
pH: 5.5 (ref 5.0–8.0)

## 2021-02-13 LAB — CBC
HCT: 35.6 % — ABNORMAL LOW (ref 36.0–46.0)
Hemoglobin: 11.7 g/dL — ABNORMAL LOW (ref 12.0–15.0)
MCH: 30.5 pg (ref 26.0–34.0)
MCHC: 32.9 g/dL (ref 30.0–36.0)
MCV: 92.7 fL (ref 80.0–100.0)
Platelets: 355 10*3/uL (ref 150–400)
RBC: 3.84 MIL/uL — ABNORMAL LOW (ref 3.87–5.11)
RDW: 11.5 % (ref 11.5–15.5)
WBC: 7.3 10*3/uL (ref 4.0–10.5)
nRBC: 0 % (ref 0.0–0.2)

## 2021-02-13 LAB — LIPASE, BLOOD: Lipase: 19 U/L (ref 11–51)

## 2021-02-13 LAB — PREGNANCY, URINE: Preg Test, Ur: NEGATIVE

## 2021-02-13 NOTE — ED Triage Notes (Signed)
Sent by [facility] for abdominal pain x 2 days.  Reports diffuse upper abd. Hx of bariatric and gallbladder surgery. Also complains of n/diarrhea.

## 2021-02-14 ENCOUNTER — Encounter (HOSPITAL_BASED_OUTPATIENT_CLINIC_OR_DEPARTMENT_OTHER): Payer: Self-pay | Admitting: Radiology

## 2021-02-14 ENCOUNTER — Emergency Department (HOSPITAL_BASED_OUTPATIENT_CLINIC_OR_DEPARTMENT_OTHER): Payer: BC Managed Care – PPO

## 2021-02-14 DIAGNOSIS — R1032 Left lower quadrant pain: Secondary | ICD-10-CM | POA: Diagnosis not present

## 2021-02-14 MED ORDER — IOHEXOL 300 MG/ML  SOLN
100.0000 mL | Freq: Once | INTRAMUSCULAR | Status: AC | PRN
  Administered 2021-02-14: 100 mL via INTRAVENOUS

## 2021-02-14 MED ORDER — SODIUM CHLORIDE 0.9 % IV BOLUS
1000.0000 mL | Freq: Once | INTRAVENOUS | Status: AC
  Administered 2021-02-14: 1000 mL via INTRAVENOUS

## 2021-02-14 MED ORDER — KETOROLAC TROMETHAMINE 30 MG/ML IJ SOLN
30.0000 mg | Freq: Once | INTRAMUSCULAR | Status: AC
Start: 2021-02-14 — End: 2021-02-14
  Administered 2021-02-14: 30 mg via INTRAVENOUS
  Filled 2021-02-14: qty 1

## 2021-02-14 MED ORDER — TRAMADOL HCL 50 MG PO TABS: 50.0000 mg | ORAL_TABLET | Freq: Four times a day (QID) | ORAL | 0 refills | Status: AC | PRN

## 2021-02-14 NOTE — ED Notes (Signed)
Pt called stating her prescription was sent to wrong drug store. Pt aware the prescription can not be changed due to controlled drug. Dr Freida Busman verified he can not resend.

## 2021-02-14 NOTE — ED Provider Notes (Signed)
[facility] EMERGENCY DEPT Provider Note   CSN: [id number] Arrival date & time: 02/13/21  2044     History Chief Complaint  Patient presents with   Abdominal Pain    Olivia Landry is a 25 y.o. female.  Patient is a 25 year old female with past medical history of prior bariatric surgery, bipolar disorder, type 2 diabetes, hypertension, GERD.  Patient presenting for evaluation of abdominal pain.  She was at work earlier today when she developed sudden onset of pain to the left upper quadrant and bilateral flanks.  She says the pain was so severe that it made her "hit the floor".  She went to the [facility] walk-in clinic, then was sent here for further evaluation due to abdominal tenderness.  She denies to me she is having any vomiting, diarrhea, constipation, bloody stool, or fevers.  The history is provided by the patient.  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   Redundant skin 12/29/2020   SBO (small bowel obstruction) (HCC) 06/12/2020   Depression 04/29/2018   GERD (gastroesophageal reflux disease) 04/29/2018   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) 03/27/2018   Contact dermatitis 09/30/2017   Class 1 obesity with body mass index (BMI) of 33.0 to 33.9 in adult 09/30/2017   Encounter for postoperative care 09/24/2017   Bilateral lower extremity edema  09/19/2017   S/P biliopancreatic diversion with duodenal switch 06/17/2017   ADHD (attention deficit hyperactivity disorder) 02/08/2017   Bipolar 1 disorder (HCC) 02/08/2017   Essential hypertension 02/08/2017   Hypothyroidism due to Hashimoto's thyroiditis 11/11/2015   Acquired genu valgum 07/20/2015   Patellofemoral disorders, unspecified knee 07/20/2015   Hyperlipidemia with target low density lipoprotein (LDL) cholesterol less than 100 mg/dL 01/60/1093   Abnormal weight loss 08/26/2013   Ketonuria 08/26/2013   Vitamin D deficiency, unspecified 03/09/2013   Abnormal weight gain 09/30/2012   Elevated BP 09/30/2012   Acanthosis nigricans 06/17/2010   Morbidly obese (HCC) 06/17/2010   Diabetes mellitus type 2, uncontrolled (HCC) 06/16/2010    Past Surgical History:  Procedure Laterality Date   ABDOMINAL SURGERY     LAPAROSCOPIC GASTRIC RESTRICTIVE DUODENAL PROCEDURE (DUODENAL SWITCH)       OB History   No obstetric history on file.     No family history on file.  Social History   Tobacco Use   Smoking status: Never   Smokeless tobacco: Never  Vaping Use   Vaping Use: Never used  Substance Use Topics   Alcohol use: Not Currently   Drug use: Yes    Types: Marijuana    Home Medications Prior to Admission medications   Medication Sig Start Date End Date Taking? Authorizing Provider  acetaminophen (TYLENOL) 500 MG tablet You can take 1000 mg every 6 hours as needed for pain.  You can buy this over the counter at any drug store.  Do not take more  than 4000 mg of Tylenol per day it can harm your liver. Patient taking differently: You can take 1000 mg every 6 hours as needed for pain.  You can buy this over the counter at any drug store.  Do not take more than 4000 mg of Tylenol per day it can harm your liver. 06/13/20   Sherrie George, PA-C  Amphetamine ER (ADZENYS XR-ODT) 18.8 MG TBED Take 1 tablet by mouth daily. 11/16/19   [provider]   amphetamine-dextroamphetamine (ADDERALL) 20 MG tablet Take 1 tablet by mouth daily.  08/15/17   [provider]  busPIRone (BUSPAR) 10 MG tablet Take 10 mg by mouth 2 (two) times daily. 12/09/19   [provider]  calcium gluconate 500 MG tablet Take 1 tablet by mouth 2 (two) times daily.    [provider]  Cholecalciferol (VITAMIN D3) 125 MCG (5000 UT) TABS 1 tablet    [provider]  ergocalciferol (VITAMIN D2) 1.25 MG (50000 UT) capsule Take 1 capsule by mouth daily. 07/22/14   [provider]  Ferrous Fumarate (HEMOCYTE - 106 MG FE) 324 (106 Fe) MG TABS tablet Take 324 mg of iron by mouth daily. 12/14/19   [provider]  furosemide (LASIX) 20 MG tablet Take 20 mg by mouth daily. 09/19/17   [provider]  LORazepam (ATIVAN) 0.5 MG tablet Take 1 tablet by mouth daily as needed.    [provider]  losartan-hydrochlorothiazide (HYZAAR) 50-12.5 MG tablet Take 2 tablets by mouth daily. 09/21/15   [provider]  Multiple Vitamin (MULTIVITAMIN) tablet Take 1 tablet by mouth daily.    [provider]  OXcarbazepine ER 600 MG TB24 Take 600 mg by mouth daily. 02/24/20   [provider]  OZEMPIC, 0.25 OR 0.5 MG/DOSE, 2 MG/1.5ML SOPN SMARTSIG:0.25 Milligram(s) SUB-Q Once a Week 12/06/20   [provider]  risperiDONE (RISPERDAL) 0.5 MG tablet SMARTSIG:1 Tablet(s) By Mouth Every Evening 11/08/20   [provider]  traMADol (ULTRAM) 50 MG tablet One tablet every 6 hours as needed for pain not relieved by plain Tylenol/acetaminophen. 06/13/20   Sherrie George, PA-C    Allergies    Amoxicillin, Keflex [cephalexin], Metformin, Penicillins, and Sumatriptan  Review of Systems   Review of Systems  Gastrointestinal:  Positive for abdominal pain.  All other systems reviewed and are negative.  Physical Exam Updated Vital Signs BP 129/66 (BP Location: Right Arm)   Pulse 90   Temp 99 F (37.2  C) (Oral)   Resp 18   Ht 5\' 3"  (1.6 m)   Wt 65.8 kg   LMP 02/05/2021 (Exact Date)   SpO2 100%   BMI 25.69 kg/m   Physical Exam Vitals and nursing note reviewed.  Constitutional:      General: She is not in acute distress.    Appearance: She is well-developed. She is not diaphoretic.  HENT:     Head: Normocephalic and atraumatic.  Cardiovascular:     Rate and Rhythm: Normal rate and regular rhythm.     Heart sounds: No murmur heard.   No friction rub. No gallop.  Pulmonary:     Effort: Pulmonary effort is normal. No respiratory distress.     Breath sounds: Normal breath sounds. No wheezing.  Abdominal:     General: Bowel sounds are normal. There is no distension.     Palpations: Abdomen is soft.     Tenderness: There is abdominal tenderness in the left upper quadrant. There is right CVA  tenderness and left CVA tenderness. There is no guarding or rebound.  Musculoskeletal:        General: Normal range of motion.     Cervical back: Normal range of motion and neck supple.  Skin:    General: Skin is warm and dry.  Neurological:     General: No focal deficit present.     Mental Status: She is alert and oriented to person, place, and time.    ED Results / Procedures / Treatments   Labs (all labs ordered are listed, but only abnormal results are displayed) Labs Reviewed  COMPREHENSIVE METABOLIC PANEL - Abnormal; Notable for the following components:      Result Value   BUN 25 (*)    AST 13 (*)    All other components within normal limits  CBC - Abnormal; Notable for the following components:   RBC 3.84 (*)    Hemoglobin 11.7 (*)    HCT 35.6 (*)    All other components within normal limits  URINALYSIS, ROUTINE W REFLEX MICROSCOPIC - Abnormal; Notable for the following components:   Ketones, ur TRACE (*)    All other components within normal limits  LIPASE, BLOOD  PREGNANCY, URINE    EKG None  Radiology No results found.  Procedures Procedures   Medications  Ordered in ED Medications - No data to display  ED Course  I have reviewed the triage vital signs and the nursing notes.  Pertinent labs & imaging results that were available during my care of the patient were reviewed by me and considered in my medical decision making (see chart for details).    MDM Rules/Calculators/A&P  Patient presenting here with sudden onset of left upper quadrant and bilateral flank pain that began while she was at work this morning she describes severe pain that "made her hit the floor".  Physical examination reveals tenderness in these areas which is out of proportion with imaging findings.  Her CAT scan is normal showing no abnormality.  Laboratory studies are all reassuring.  She has no white count, normal lipase, normal LFTs and electrolytes, clear urine, and negative pregnancy test.  At this point, patient seems appropriate for discharge.  She will be given a small quantity of tramadol which can be taken as needed for pain.  She is to follow-up with her bariatric surgeon if she is not improving.  Final Clinical Impression(s) / ED Diagnoses Final diagnoses:  None    Rx / DC Orders ED Discharge Orders     None        Geoffery Lyons, MD 02/14/21 [id number]

## 2021-02-14 NOTE — ED Notes (Signed)
Transport documentation incorrect. Documented on incorrect patient.

## 2021-02-14 NOTE — Discharge Instructions (Addendum)
Take ibuprofen 600 mg every 6 hours as needed for pain.  Begin taking tramadol as prescribed as needed for pain not relieved with ibuprofen.  Follow-up with your surgeon if your symptoms are not improving in the next few days.

## 2021-10-10 ENCOUNTER — Encounter (HOSPITAL_COMMUNITY): Payer: Self-pay | Admitting: Emergency Medicine

## 2021-10-10 ENCOUNTER — Emergency Department (HOSPITAL_COMMUNITY)
Admission: EM | Admit: 2021-10-10 | Discharge: 2021-10-11 | Disposition: A | Discharge status: Home / Self Care | Payer: BC Managed Care – PPO | Attending: Emergency Medicine | Admitting: Emergency Medicine

## 2021-10-10 ENCOUNTER — Emergency Department (HOSPITAL_COMMUNITY): Payer: BC Managed Care – PPO

## 2021-10-10 ENCOUNTER — Other Ambulatory Visit: Payer: Self-pay

## 2021-10-10 DIAGNOSIS — D649 Anemia, unspecified: Secondary | ICD-10-CM | POA: Diagnosis not present

## 2021-10-10 DIAGNOSIS — R1084 Generalized abdominal pain: Secondary | ICD-10-CM | POA: Insufficient documentation

## 2021-10-10 DIAGNOSIS — Z79899 Other long term (current) drug therapy: Secondary | ICD-10-CM | POA: Diagnosis not present

## 2021-10-10 DIAGNOSIS — I10 Essential (primary) hypertension: Secondary | ICD-10-CM | POA: Insufficient documentation

## 2021-10-10 DIAGNOSIS — I959 Hypotension, unspecified: Secondary | ICD-10-CM | POA: Diagnosis not present

## 2021-10-10 DIAGNOSIS — R112 Nausea with vomiting, unspecified: Secondary | ICD-10-CM | POA: Insufficient documentation

## 2021-10-10 DIAGNOSIS — R109 Unspecified abdominal pain: Secondary | ICD-10-CM | POA: Diagnosis present

## 2021-10-10 DIAGNOSIS — E119 Type 2 diabetes mellitus without complications: Secondary | ICD-10-CM | POA: Diagnosis not present

## 2021-10-10 DIAGNOSIS — R7989 Other specified abnormal findings of blood chemistry: Secondary | ICD-10-CM | POA: Insufficient documentation

## 2021-10-10 LAB — COMPREHENSIVE METABOLIC PANEL
ALT: 36 U/L (ref 0–44)
AST: 45 U/L — ABNORMAL HIGH (ref 15–41)
Albumin: 3.9 g/dL (ref 3.5–5.0)
Alkaline Phosphatase: 49 U/L (ref 38–126)
Anion gap: 7 (ref 5–15)
BUN: 44 mg/dL — ABNORMAL HIGH (ref 6–20)
CO2: 27 mmol/L (ref 22–32)
Calcium: 9 mg/dL (ref 8.9–10.3)
Chloride: 103 mmol/L (ref 98–111)
Creatinine, Ser: 1.52 mg/dL — ABNORMAL HIGH (ref 0.44–1.00)
GFR, Estimated: 49 mL/min — ABNORMAL LOW (ref >60)
Glucose, Bld: 113 mg/dL — ABNORMAL HIGH (ref 70–99)
Potassium: 4.3 mmol/L (ref 3.5–5.1)
Sodium: 137 mmol/L (ref 135–145)
Total Bilirubin: 0.5 mg/dL (ref 0.3–1.2)
Total Protein: 7.1 g/dL (ref 6.5–8.1)

## 2021-10-10 LAB — URINALYSIS, ROUTINE W REFLEX MICROSCOPIC
Bilirubin Urine: NEGATIVE
Glucose, UA: NEGATIVE mg/dL
Hgb urine dipstick: NEGATIVE
Ketones, ur: NEGATIVE mg/dL
Leukocytes,Ua: NEGATIVE
Nitrite: NEGATIVE
Protein, ur: NEGATIVE mg/dL
Specific Gravity, Urine: 1.009 (ref 1.005–1.030)
pH: 5 (ref 5.0–8.0)

## 2021-10-10 LAB — CBC
HCT: 30.9 % — ABNORMAL LOW (ref 36.0–46.0)
Hemoglobin: 10.1 g/dL — ABNORMAL LOW (ref 12.0–15.0)
MCH: 30.6 pg (ref 26.0–34.0)
MCHC: 32.7 g/dL (ref 30.0–36.0)
MCV: 93.6 fL (ref 80.0–100.0)
Platelets: 264 10*3/uL (ref 150–400)
RBC: 3.3 MIL/uL — ABNORMAL LOW (ref 3.87–5.11)
RDW: 12.4 % (ref 11.5–15.5)
WBC: 5.5 10*3/uL (ref 4.0–10.5)
nRBC: 0 % (ref 0.0–0.2)

## 2021-10-10 LAB — LIPASE, BLOOD: Lipase: 45 U/L (ref 11–51)

## 2021-10-10 LAB — I-STAT BETA HCG BLOOD, ED (MC, WL, AP ONLY): I-stat hCG, quantitative: <5 m[IU]/mL (ref <5)

## 2021-10-10 MED ORDER — DIPHENHYDRAMINE HCL 50 MG/ML IJ SOLN
12.5000 mg | Freq: Once | INTRAMUSCULAR | Status: AC
  Administered 2021-10-10: 12.5 mg via INTRAVENOUS
  Filled 2021-10-10: qty 1

## 2021-10-10 MED ORDER — IOHEXOL 300 MG/ML  SOLN
100.0000 mL | Freq: Once | INTRAMUSCULAR | Status: AC | PRN
  Administered 2021-10-10: 80 mL via INTRAVENOUS

## 2021-10-10 MED ORDER — SODIUM CHLORIDE 0.9 % IV BOLUS
2000.0000 mL | Freq: Once | INTRAVENOUS | Status: AC
  Administered 2021-10-10: 2000 mL via INTRAVENOUS

## 2021-10-10 MED ORDER — FENTANYL CITRATE PF 50 MCG/ML IJ SOSY
50.0000 ug | PREFILLED_SYRINGE | Freq: Once | INTRAMUSCULAR | Status: AC
  Administered 2021-10-10: 50 ug via INTRAVENOUS
  Filled 2021-10-10: qty 1

## 2021-10-10 MED ORDER — PROCHLORPERAZINE EDISYLATE 10 MG/2ML IJ SOLN
10.0000 mg | Freq: Once | INTRAMUSCULAR | Status: AC
  Administered 2021-10-10: 10 mg via INTRAVENOUS
  Filled 2021-10-10: qty 2

## 2021-10-10 MED ORDER — ONDANSETRON HCL 4 MG/2ML IJ SOLN
4.0000 mg | Freq: Once | INTRAMUSCULAR | Status: AC | PRN
  Administered 2021-10-10: 4 mg via INTRAVENOUS
  Filled 2021-10-10: qty 2

## 2021-10-10 NOTE — ED Notes (Signed)
Patient sleeping, woke up when this RN entered the room and fell back asleep.  Respirations even and unlabored.  ?

## 2021-10-10 NOTE — ED Notes (Signed)
Patient now c/o centralized abd pain and states it's the worse pain she's ever felt.  ?

## 2021-10-10 NOTE — ED Triage Notes (Signed)
BIB EMS c/o bilateral flank pain and vomiting x 2 weeks.  Patient was seen by PCP 2 days ago and was told she has stage 3 kidney disease, and patient reports they scheduled her an appointment 6 months from now for CT scan.  Patient reports taking tylenol frequently and heavily for fibromyalgia pain.   ? ?118/84 ?90  ?CBG 162 ?350 mL of fluid ?

## 2021-10-10 NOTE — ED Provider Notes (Signed)
[facility] DEPT Provider Note   CSN: [id number] Arrival date & time: 10/10/21  2128     History  Chief Complaint  Patient presents with   Abdominal Pain    Bilateral flank pain    Olivia Landry is a 26 y.o. female with a hx of diabetes mellitus, hypertension, bipolar 1 disorder, GERD, hyperlipidemia, and prior bariatric surgery who presents to the ED with complaints of flank and abdominal pain x 2 weeks. Patient states pain is primarily in the left flank into the left abdomen with associated N/V/D. Having daily vomiting. Over the past few days started to have same pain on the right side but less severe than the left. She ate some steak cut into small pieces earlier and had subsequent multiple episodes of emesis w/ intensified pain. She denies fever, hematemesis, melena, hematochezia, dysuria, hematuria, or syncope. Patient reports she had a renal abscess and a bowel obstruction in the past and her sxs feel similar to each of these.    HPI     Home Medications Prior to Admission medications   Medication Sig Start Date End Date Taking? Authorizing Provider  acetaminophen (TYLENOL) 500 MG tablet You can take 1000 mg every 6 hours as needed for pain.  You can buy this over the counter at any drug store.  Do not take more than 4000 mg of Tylenol per day it can harm your liver. Patient taking differently: You can take 1000 mg every 6 hours as needed for pain.  You can buy this over the counter at any drug store.  Do not take more than 4000 mg of Tylenol per day it can harm your liver. 06/13/20   Earnstine Regal, PA-C  Amphetamine ER (ADZENYS XR-ODT) 18.8 MG TBED Take 1 tablet by mouth daily. 11/16/19   [provider]  amphetamine-dextroamphetamine (ADDERALL) 20 MG tablet Take 1 tablet by mouth daily.  08/15/17   [provider]  busPIRone (BUSPAR) 10 MG tablet Take 10 mg by mouth 2 (two) times daily. 12/09/19   [provider]  calcium  gluconate 500 MG tablet Take 1 tablet by mouth 2 (two) times daily.    [provider]  Cholecalciferol (VITAMIN D3) 125 MCG (5000 UT) TABS 1 tablet    [provider]  ergocalciferol (VITAMIN D2) 1.25 MG (50000 UT) capsule Take 1 capsule by mouth daily. 07/22/14   [provider]  Ferrous Fumarate (HEMOCYTE - 106 MG FE) 324 (106 Fe) MG TABS tablet Take 324 mg of iron by mouth daily. 12/14/19   [provider]  furosemide (LASIX) 20 MG tablet Take 20 mg by mouth daily. 09/19/17   [provider]  LORazepam (ATIVAN) 0.5 MG tablet Take 1 tablet by mouth daily as needed.    [provider]  losartan-hydrochlorothiazide (HYZAAR) 50-12.5 MG tablet Take 2 tablets by mouth daily. 09/21/15   [provider]  Multiple Vitamin (MULTIVITAMIN) tablet Take 1 tablet by mouth daily.    [provider]  OXcarbazepine ER 600 MG TB24 Take 600 mg by mouth daily. 02/24/20   [provider]  OZEMPIC, 0.25 OR 0.5 MG/DOSE, 2 MG/1.5ML SOPN SMARTSIG:0.25 Milligram(s) SUB-Q Once a Week 12/06/20   [provider]  risperiDONE (RISPERDAL) 0.5 MG tablet SMARTSIG:1 Tablet(s) By Mouth Every Evening 11/08/20   [provider]  traMADol (ULTRAM) 50 MG tablet Take 1 tablet (50 mg total) by mouth every 6 (six) hours as needed. 02/14/21   Veryl Speak, MD  Allergies    Amoxicillin, Keflex [cephalexin], Metformin, Penicillins, and Sumatriptan    Review of Systems   Review of Systems  Constitutional:  Positive for chills. Negative for fever.  Respiratory:  Negative for cough and shortness of breath.   Cardiovascular:  Negative for chest pain.  Gastrointestinal:  Positive for abdominal pain, diarrhea, nausea and vomiting. Negative for anal bleeding and blood in stool.  Genitourinary:  Positive for flank pain. Negative for dysuria, hematuria and urgency.  Neurological:  Negative for syncope.  All other systems reviewed and are  negative.  Physical Exam Updated Vital Signs BP 106/72 (BP Location: Left Arm)    Pulse 86    Temp 97.7 F (36.5 C) (Oral)    Resp 15    SpO2 100%  Physical Exam Vitals and nursing note reviewed.  Constitutional:      General: She is not in acute distress.    Appearance: She is well-developed. She is not toxic-appearing.  HENT:     Head: Normocephalic and atraumatic.     Mouth/Throat:     Mouth: Mucous membranes are dry.  Eyes:     General:        Right eye: No discharge.        Left eye: No discharge.     Conjunctiva/sclera: Conjunctivae normal.  Cardiovascular:     Rate and Rhythm: Normal rate and regular rhythm.  Pulmonary:     Effort: Pulmonary effort is normal. No respiratory distress.     Breath sounds: Normal breath sounds. No wheezing, rhonchi or rales.  Abdominal:     General: There is no distension.     Palpations: Abdomen is soft.     Tenderness: There is abdominal tenderness in the right upper quadrant, epigastric area, left upper quadrant and left lower quadrant. There is right CVA tenderness and left CVA tenderness. There is no guarding or rebound.     Comments: Bowel sounds present.   Musculoskeletal:     Cervical back: Neck supple.  Skin:    General: Skin is warm and dry.     Findings: No rash.  Neurological:     Mental Status: She is alert.     Comments: Sensation & strength grossly intact to Les. Clear speech.   Psychiatric:        Behavior: Behavior normal.    ED Results / Procedures / Treatments   Labs (all labs ordered are listed, but only abnormal results are displayed) Labs Reviewed  CBC - Abnormal; Notable for the following components:      Result Value   RBC 3.30 (*)    Hemoglobin 10.1 (*)    HCT 30.9 (*)    All other components within normal limits  LIPASE, BLOOD  COMPREHENSIVE METABOLIC PANEL  URINALYSIS, ROUTINE W REFLEX MICROSCOPIC  I-STAT BETA HCG BLOOD, ED ([facility], [facility], [facility] ONLY)    EKG None  Radiology CT Abdomen Pelvis W  Contrast  Result Date: 10/10/2021 CLINICAL DATA:  Flank pain, kidney stone suspected flank and abdominal pain, L>R EXAM: CT ABDOMEN AND PELVIS WITH CONTRAST TECHNIQUE: Multidetector CT imaging of the abdomen and pelvis was performed using the standard protocol following bolus administration of intravenous contrast. RADIATION DOSE REDUCTION: This exam was performed according to the departmental dose-optimization program which includes automated exposure control, adjustment of the mA and/or kV according to patient size and/or use of iterative reconstruction technique. CONTRAST:  23mL OMNIPAQUE IOHEXOL 300 MG/ML  SOLN COMPARISON:  CT 02/14/2021 FINDINGS: Lower chest: No acute airspace  disease or pleural effusion. Hepatobiliary: No focal hepatic lesion. The previous subcapsular low-density lesion in the right hepatic dome on prior is not seen on the current exam. Post cholecystectomy tummy without biliary dilatation. Pancreas: No ductal dilatation or inflammation. Spleen: Normal in size without focal abnormality. Adrenals/Urinary Tract: Normal adrenal glands. No hydronephrosis. No renal calculi peer homogeneous bilateral renal enhancement. No evidence of focal renal abnormality. The urinary bladder is partially distended. Stomach/Bowel: Bowel assessment is limited in the absence of enteric contrast. Bariatric surgery with duodenal switch procedure. Dilated fluid-filled small bowel, some of which is chronic, however there has been increase small-bowel distention on fluid from prior exam. There is fecalization of distal small bowel contents. There is no bowel inflammation or wall thickening. Moderate stool burden in the colon. The appendix is normal. Vascular/Lymphatic: Normal caliber abdominal aorta. Patent portal and mesenteric veins. There are multiple prominent central mesenteric lymph nodes are similar to prior and likely reactive. Reproductive: Anteverted uterus.  No adnexal mass. Other: No free air or free fluid.   No abdominopelvic collection. Musculoskeletal: There are no acute or suspicious osseous abnormalities. IMPRESSION: 1. No renal stones or obstructive uropathy. 2. Dilated fluid-filled small bowel, some of which is chronic, however there has been increase in small-bowel distention on fluid from prior exam. Fecalization of distal small bowel contents. Findings suggest slow transit. No bowel inflammation or wall thickening. 3. Prominent central mesenteric lymph nodes are similar to prior and likely reactive. Electronically Signed   By: Keith Rake M.D.   On: 10/10/2021 23:01    Procedures Procedures    Medications Ordered in ED Medications  ondansetron (ZOFRAN) injection 4 mg (4 mg Intravenous Given 10/10/21 2200)    ED Course/ Medical Decision Making/ A&P                           Medical Decision Making Amount and/or Complexity of Data Reviewed Labs: ordered. Radiology: ordered.  Risk OTC drugs. Prescription drug management.  Patient presents to the ED with complaints of flank/abdominal pain w/ N/V/D, this involves an extensive number of treatment options, and is a complaint that carries with it a high risk of complications and morbidity. Nontoxic, vitals fairly unremarkable. Bilateral CVA tenderness with upper and left sided abdominal tenderness.    Additional history obtained:  Chart review/nursing note review.   Lab Tests:  I reviewed & interpreted labs including:  CBC: mild anemia similar to prior ranges.  CMP: increasing creatinine/BUN Lipase: WNL UA: No UTI Preg test: Negative.   Patient has received zofran for nausea, I have ordered fentanyl for pain and 2L NS for hydration. Plan for CT A/P to further assess.   Imaging Studies ordered:  I ordered and viewed the following imaging, agree with radiologist impression:  CT A/P 1. No renal stones or obstructive uropathy. 2. Dilated fluid-filled small bowel, some of which is chronic, however there has been increase in  small-bowel distention on fluid from prior exam. Fecalization of distal small bowel contents. Findings suggest slow transit. No bowel inflammation or wall thickening. 3. Prominent central mesenteric lymph nodes are similar to prior and likely reactive.  ED Course:  Patient with some mild hypotension while sleeping, this improved with fluids.  Her work-up in the ED has been overall reassuring.  Her creatinine is mildly increased compared to prior as is her BUN, she has received 3 L of normal saline while in the department, this will need PCP recheck.  No  critical electrolyte derangement.  CT without acute surgical process.  Repeat abdominal exam remains without peritoneal signs.  Patient is tolerating p.o. in the ED and is ambulatory.  She overall seems reasonable for discharge home with supportive care and outpatient follow-up, will trial zofran, pepcid, bentyl. We discussed results, treatment plan, need for follow-up, and return precautions with the patient. Provided opportunity for questions, patient confirmed understanding and is in agreement with plan.   This is a shared visit with supervising physician Dr. Stark Jock who has independently evaluated patient & provided guidance in evaluation/management/disposition, in agreement with care    Portions of this note were generated with Dragon dictation software. Dictation errors may occur despite best attempts at proofreading.  Final Clinical Impression(s) / ED Diagnoses Final diagnoses:  Generalized abdominal pain    Rx / DC Orders ED Discharge Orders          Ordered    ondansetron (ZOFRAN-ODT) 4 MG disintegrating tablet  Every 8 hours PRN        10/11/21 0510    dicyclomine (BENTYL) 20 MG tablet  Every 8 hours PRN        10/11/21 0510    famotidine (PEPCID) 20 MG tablet  2 times daily PRN        10/11/21 0510              Devora Tortorella, Glynda Jaeger, PA-C 10/11/21 [id number]    Veryl Speak, MD 10/16/21 [phone]

## 2021-10-11 DIAGNOSIS — R1084 Generalized abdominal pain: Secondary | ICD-10-CM | POA: Diagnosis not present

## 2021-10-11 MED ORDER — SODIUM CHLORIDE 0.9 % IV BOLUS
1000.0000 mL | Freq: Once | INTRAVENOUS | Status: AC
  Administered 2021-10-11: 1000 mL via INTRAVENOUS

## 2021-10-11 MED ORDER — LIDOCAINE VISCOUS HCL 2 % MT SOLN
15.0000 mL | Freq: Once | OROMUCOSAL | Status: AC
  Administered 2021-10-11: 15 mL via ORAL
  Filled 2021-10-11: qty 15

## 2021-10-11 MED ORDER — FAMOTIDINE 20 MG PO TABS: 20.0000 mg | ORAL_TABLET | Freq: Two times a day (BID) | ORAL | 0 refills | Status: AC | PRN

## 2021-10-11 MED ORDER — ONDANSETRON 4 MG PO TBDP: 4.0000 mg | ORAL_TABLET | Freq: Three times a day (TID) | ORAL | 0 refills | Status: DC | PRN

## 2021-10-11 MED ORDER — ALUM & MAG HYDROXIDE-SIMETH 200-200-20 MG/5ML PO SUSP
30.0000 mL | Freq: Once | ORAL | Status: AC
  Administered 2021-10-11: 30 mL via ORAL
  Filled 2021-10-11: qty 30

## 2021-10-11 MED ORDER — DICYCLOMINE HCL 20 MG PO TABS: 20.0000 mg | ORAL_TABLET | Freq: Three times a day (TID) | ORAL | 0 refills | Status: AC | PRN

## 2021-10-11 NOTE — ED Notes (Addendum)
Pt. Ambulated to the bathroom without difficulty and back to her room without assistance. Pt. Gait steady on her feet. ?

## 2021-10-11 NOTE — Discharge Instructions (Addendum)
You are seen in the emergency department today for flank/abdominal pain with nausea, vomiting, and diarrhea.  Your work-up was overall reassuring.  Your creatinine which looks at kidney function was mildly elevated, we suspect this is dehydration related, you were given multiple liters of fluids in the emergency department.  Please have this rechecked by your primary care provider.  The remainder of your lab work was reassuring.  Your CT scan overall appear fairly similar to prior with some findings of slow transit in your bowels however no obstruction. ? ? ?Take Zofran every 8 hours as needed for nausea and vomiting, Pepcid every 12 hours as needed for abdominal pain and Bentyl every 8 hours as needed for abdominal pain. ? ? ?We have prescribed you new medication(s) today. Discuss the medications prescribed today with your pharmacist as they can have adverse effects and interactions with your other medicines including over the counter and prescribed medications. Seek medical evaluation if you start to experience new or abnormal symptoms after taking one of these medicines, seek care immediately if you start to experience difficulty breathing, feeling of your throat closing, facial swelling, or rash as these could be indications of a more serious allergic reaction ? ? ?Avoid NSAIDs like ibuprofen/Advil/Aleve/Goody powder/naproxen etc. as these can further irritate the kidneys. ? ? ?Follow-up with primary care as well as GI soon as possible.  Return to the ER for any new or worsening symptoms including but not limited to new or worsening pain, fever, inability to keep fluids down, blood in vomit/stool, passing out, or any other concerns. ? ? ? ?

## 2021-10-18 ENCOUNTER — Other Ambulatory Visit: Payer: Self-pay | Admitting: Surgery

## 2021-10-18 ENCOUNTER — Other Ambulatory Visit (HOSPITAL_BASED_OUTPATIENT_CLINIC_OR_DEPARTMENT_OTHER): Payer: Self-pay | Admitting: Surgery

## 2021-10-18 DIAGNOSIS — R109 Unspecified abdominal pain: Secondary | ICD-10-CM

## 2021-10-20 ENCOUNTER — Other Ambulatory Visit: Payer: Self-pay

## 2021-10-20 ENCOUNTER — Ambulatory Visit (HOSPITAL_BASED_OUTPATIENT_CLINIC_OR_DEPARTMENT_OTHER)
Admission: RE | Admit: 2021-10-20 | Discharge: 2021-10-20 | Disposition: A | Discharge status: Home / Self Care | Payer: BC Managed Care – PPO | Source: Ambulatory Visit | Attending: Surgery | Admitting: Surgery

## 2021-10-20 DIAGNOSIS — R109 Unspecified abdominal pain: Secondary | ICD-10-CM | POA: Insufficient documentation

## 2021-10-20 MED ORDER — IOHEXOL 300 MG/ML  SOLN
100.0000 mL | Freq: Once | INTRAMUSCULAR | Status: AC | PRN
  Administered 2021-10-20: 80 mL via INTRAVENOUS

## 2021-10-30 ENCOUNTER — Other Ambulatory Visit (HOSPITAL_BASED_OUTPATIENT_CLINIC_OR_DEPARTMENT_OTHER): Payer: Self-pay | Admitting: Surgery

## 2021-11-27 ENCOUNTER — Emergency Department (HOSPITAL_BASED_OUTPATIENT_CLINIC_OR_DEPARTMENT_OTHER): Payer: BC Managed Care – PPO

## 2021-11-27 ENCOUNTER — Encounter (HOSPITAL_BASED_OUTPATIENT_CLINIC_OR_DEPARTMENT_OTHER): Payer: Self-pay

## 2021-11-27 ENCOUNTER — Emergency Department (HOSPITAL_BASED_OUTPATIENT_CLINIC_OR_DEPARTMENT_OTHER)
Admission: EM | Admit: 2021-11-27 | Discharge: 2021-11-27 | Disposition: A | Discharge status: Home / Self Care | Payer: BC Managed Care – PPO | Attending: Emergency Medicine | Admitting: Emergency Medicine

## 2021-11-27 ENCOUNTER — Other Ambulatory Visit: Payer: Self-pay

## 2021-11-27 DIAGNOSIS — R0602 Shortness of breath: Secondary | ICD-10-CM | POA: Insufficient documentation

## 2021-11-27 DIAGNOSIS — R600 Localized edema: Secondary | ICD-10-CM | POA: Diagnosis present

## 2021-11-27 DIAGNOSIS — N183 Chronic kidney disease, stage 3 unspecified: Secondary | ICD-10-CM | POA: Diagnosis not present

## 2021-11-27 DIAGNOSIS — R609 Edema, unspecified: Secondary | ICD-10-CM

## 2021-11-27 HISTORY — DX: Disorder of kidney and ureter, unspecified: N28.9

## 2021-11-27 LAB — COMPREHENSIVE METABOLIC PANEL
ALT: 29 U/L (ref 0–44)
AST: 19 U/L (ref 15–41)
Albumin: 3.2 g/dL — ABNORMAL LOW (ref 3.5–5.0)
Alkaline Phosphatase: 53 U/L (ref 38–126)
Anion gap: 5 (ref 5–15)
BUN: 28 mg/dL — ABNORMAL HIGH (ref 6–20)
CO2: 28 mmol/L (ref 22–32)
Calcium: 8.6 mg/dL — ABNORMAL LOW (ref 8.9–10.3)
Chloride: 106 mmol/L (ref 98–111)
Creatinine, Ser: 0.94 mg/dL (ref 0.44–1.00)
GFR, Estimated: >60 mL/min (ref >60)
Glucose, Bld: 89 mg/dL (ref 70–99)
Potassium: 4.6 mmol/L (ref 3.5–5.1)
Sodium: 139 mmol/L (ref 135–145)
Total Bilirubin: 0.5 mg/dL (ref 0.3–1.2)
Total Protein: 5.7 g/dL — ABNORMAL LOW (ref 6.5–8.1)

## 2021-11-27 LAB — CBC WITH DIFFERENTIAL/PLATELET
Abs Immature Granulocytes: 0.02 10*3/uL (ref 0.00–0.07)
Basophils Absolute: 0 10*3/uL (ref 0.0–0.1)
Basophils Relative: 1 %
Eosinophils Absolute: 0.1 10*3/uL (ref 0.0–0.5)
Eosinophils Relative: 1 %
HCT: 27.6 % — ABNORMAL LOW (ref 36.0–46.0)
Hemoglobin: 8.8 g/dL — ABNORMAL LOW (ref 12.0–15.0)
Immature Granulocytes: 0 %
Lymphocytes Relative: 39 %
Lymphs Abs: 2.5 10*3/uL (ref 0.7–4.0)
MCH: 31.9 pg (ref 26.0–34.0)
MCHC: 31.9 g/dL (ref 30.0–36.0)
MCV: 100 fL (ref 80.0–100.0)
Monocytes Absolute: 0.5 10*3/uL (ref 0.1–1.0)
Monocytes Relative: 8 %
Neutro Abs: 3.3 10*3/uL (ref 1.7–7.7)
Neutrophils Relative %: 51 %
Platelets: 337 10*3/uL (ref 150–400)
RBC: 2.76 MIL/uL — ABNORMAL LOW (ref 3.87–5.11)
RDW: 15.6 % — ABNORMAL HIGH (ref 11.5–15.5)
WBC: 6.4 10*3/uL (ref 4.0–10.5)
nRBC: 0 % (ref 0.0–0.2)

## 2021-11-27 LAB — BRAIN NATRIURETIC PEPTIDE: B Natriuretic Peptide: 20 pg/mL (ref 0.0–100.0)

## 2021-11-27 MED ORDER — ALBUTEROL SULFATE HFA 108 (90 BASE) MCG/ACT IN AERS
2.0000 | INHALATION_SPRAY | RESPIRATORY_TRACT | Status: DC | PRN
  Administered 2021-11-27: 2 via RESPIRATORY_TRACT
  Filled 2021-11-27: qty 6.7

## 2021-11-27 MED ORDER — FUROSEMIDE 20 MG PO TABS
20.0000 mg | ORAL_TABLET | Freq: Every day | ORAL | 0 refills | Status: AC
Start: 2021-11-27 — End: ?

## 2021-11-27 MED ORDER — FUROSEMIDE 10 MG/ML IJ SOLN
20.0000 mg | Freq: Once | INTRAMUSCULAR | Status: AC
  Administered 2021-11-27: 20 mg via INTRAVENOUS
  Filled 2021-11-27: qty 2

## 2021-11-27 NOTE — ED Notes (Signed)
Patient transported to X-ray 

## 2021-11-27 NOTE — ED Notes (Signed)
ED Provider at bedside. 

## 2021-11-27 NOTE — ED Triage Notes (Signed)
Pt reports shortness of breath for 2 days. Swelling to lower legs that pt reports has moved up legs , abdomen and face. RT in triage to assess pt ?

## 2021-11-27 NOTE — ED Notes (Signed)
Dc instructions and scripts reviewed with pt no questions or concerns at this time. Will follow up PCP.  ?

## 2021-11-27 NOTE — ED Provider Notes (Signed)
?[facility] EMERGENCY DEPARTMENT ?Provider Note ? ? ?CSN: [id number] ?Arrival date & time: 11/27/21  1819 ? ?  ? ?History ? ?Chief Complaint  ?Patient presents with  ? Shortness of Breath  ? ? ?Olivia Landry is a 26 y.o. female. ? ?26 yo F with a chief complaints of swelling and difficulty breathing.  Tells me that she has had trouble swelling for a couple years.  She seems to have a get worse from time to time.  Is never had this bad.  She feels like it all the way up to her face and she been having some trouble breathing when she lays back flat. ? ? ?Shortness of Breath ? ?  ? ?Home Medications ?Prior to Admission medications   ?Medication Sig Start Date End Date Taking? Authorizing Provider  ?acetaminophen (TYLENOL) 500 MG tablet You can take 1000 mg every 6 hours as needed for pain.  You can buy this over the counter at any drug store.  Do not take more than 4000 mg of Tylenol per day it can harm your liver. ?Patient taking differently: You can take 1000 mg every 6 hours as needed for pain.  You can buy this over the counter at any drug store.  Do not take more than 4000 mg of Tylenol per day it can harm your liver. 06/13/20   Sherrie GeorgeJennings, Willard, PA-C  ?Amphetamine ER (ADZENYS XR-ODT) 18.8 MG TBED Take 1 tablet by mouth daily. 11/16/19   [provider]  ?amphetamine-dextroamphetamine (ADDERALL) 20 MG tablet Take 1 tablet by mouth daily.  08/15/17   [provider]  ?Cholecalciferol (VITAMIN D3) 125 MCG (5000 UT) TABS Take 5,000 Units by mouth daily.    [provider]  ?dicyclomine (BENTYL) 20 MG tablet Take 1 tablet (20 mg total) by mouth every 8 (eight) hours as needed for spasms. 10/11/21   Petrucelli, Samantha R, PA-C  ?ergocalciferol (VITAMIN D2) 1.25 MG (50000 UT) capsule Take 1 capsule by mouth daily. 07/22/14   [provider]  ?famotidine (PEPCID) 20 MG tablet Take 1 tablet (20 mg total) by mouth 2 (two) times daily as needed for heartburn or indigestion. 10/11/21    Petrucelli, Pleas KochSamantha R, PA-C  ?Ferrous Fumarate (HEMOCYTE - 106 MG FE) 324 (106 Fe) MG TABS tablet Take 324 mg of iron by mouth daily. 12/14/19   [provider]  ?furosemide (LASIX) 20 MG tablet Take 1 tablet (20 mg total) by mouth daily. 11/27/21   Melene PlanFloyd, Earnestine Tuohey, DO  ?LORazepam (ATIVAN) 0.5 MG tablet Take 1 tablet by mouth daily as needed.    [provider]  ?losartan-hydrochlorothiazide (HYZAAR) 50-12.5 MG tablet Take 2 tablets by mouth daily. 09/21/15   [provider]  ?Multiple Vitamin (MULTIVITAMIN) tablet Take 1 tablet by mouth daily.    [provider]  ?ondansetron (ZOFRAN-ODT) 4 MG disintegrating tablet Take 1 tablet (4 mg total) by mouth every 8 (eight) hours as needed for nausea or vomiting. 10/11/21   Petrucelli, Pleas KochSamantha R, PA-C  ?OXcarbazepine ER 600 MG TB24 Take 600 mg by mouth daily. 02/24/20   [provider]  ?OZEMPIC, 0.25 OR 0.5 MG/DOSE, 2 MG/1.5ML SOPN SMARTSIG:0.25 Milligram(s) SUB-Q Once a Week 12/06/20   [provider]  ?risperiDONE (RISPERDAL) 0.5 MG tablet SMARTSIG:1 Tablet(s) By Mouth Every Evening 11/08/20   [provider]  ?traMADol (ULTRAM) 50 MG tablet Take 1 tablet (50 mg total) by mouth every 6 (six) hours as needed. 02/14/21   Geoffery Lyonselo, Douglas, MD  ?   ? ?  Allergies    ?Amoxicillin, Keflex [cephalexin], Metformin, Penicillins, and Sumatriptan   ? ?Review of Systems   ?Review of Systems  ?Respiratory:  Positive for shortness of breath.   ? ?Physical Exam ?Updated Vital Signs ?BP 109/73   Pulse (!) 54   Resp 13   Ht 5\' 3"  (1.6 m)   Wt 63.5 kg   LMP 11/13/2021   SpO2 100%   BMI 24.80 kg/m?  ?Physical Exam ?Vitals and nursing note reviewed.  ?Constitutional:   ?   General: She is not in acute distress. ?   Appearance: She is well-developed. She is not diaphoretic.  ?HENT:  ?   Head: Normocephalic and atraumatic.  ?Eyes:  ?   Pupils: Pupils are equal, round, and reactive to light.  ?Cardiovascular:  ?   Rate and Rhythm: Normal  rate and regular rhythm.  ?   Heart sounds: No murmur heard. ?  No friction rub. No gallop.  ?Pulmonary:  ?   Effort: Pulmonary effort is normal.  ?   Breath sounds: No wheezing or rales.  ?Abdominal:  ?   General: There is no distension.  ?   Palpations: Abdomen is soft.  ?   Tenderness: There is no abdominal tenderness.  ?Musculoskeletal:     ?   General: No tenderness.  ?   Cervical back: Normal range of motion and neck supple.  ?   Right lower leg: Edema present.  ?   Left lower leg: Edema present.  ?   Comments: Edema up to the thighs bilaterally.   ?Skin: ?   General: Skin is warm and dry.  ?Neurological:  ?   Mental Status: She is alert and oriented to person, place, and time.  ?Psychiatric:     ?   Behavior: Behavior normal.  ? ? ?ED Results / Procedures / Treatments   ?Labs ?(all labs ordered are listed, but only abnormal results are displayed) ?Labs Reviewed  ?CBC WITH DIFFERENTIAL/PLATELET - Abnormal; Notable for the following components:  ?    Result Value  ? RBC 2.76 (*)   ? Hemoglobin 8.8 (*)   ? HCT 27.6 (*)   ? RDW 15.6 (*)   ? All other components within normal limits  ?COMPREHENSIVE METABOLIC PANEL - Abnormal; Notable for the following components:  ? BUN 28 (*)   ? Calcium 8.6 (*)   ? Total Protein 5.7 (*)   ? Albumin 3.2 (*)   ? All other components within normal limits  ?BRAIN NATRIURETIC PEPTIDE  ? ? ?EKG ?EKG Interpretation ? ?Date/Time:  Monday November 27 2021 18:49:57 EDT ?Ventricular Rate:  98 ?PR Interval:  121 ?QRS Duration: 90 ?QT Interval:  338 ?QTC Calculation: 432 ?R Axis:   88 ?Text Interpretation: Sinus rhythm downsloping st segment in lead III now resolved Otherwise no significant change Confirmed by [id number] [phone]) on 11/27/2021 6:51:10 PM ? ?Radiology ?DG Chest 2 View ? ?Result Date: 11/27/2021 ?CLINICAL DATA:  Shortness of breath EXAM: CHEST - 2 VIEW COMPARISON:  06/12/2020 FINDINGS: The heart size and mediastinal contours are within normal limits. Both lungs are clear. The  visualized skeletal structures are unremarkable. IMPRESSION: No active cardiopulmonary disease. Electronically Signed   By: 13/02/2020 M.D.   On: 11/27/2021 19:24   ? ?Procedures ?Procedures  ? ? ?Medications Ordered in ED ?Medications  ?albuterol (VENTOLIN HFA) 108 (90 Base) MCG/ACT inhaler 2 puff (has no administration in time range)  ?furosemide (LASIX) injection 20 mg (has no administration in  time range)  ? ? ?ED Course/ Medical Decision Making/ A&P ?  ?                        ?Medical Decision Making ?Amount and/or Complexity of Data Reviewed ?Labs: ordered. ?Radiology: ordered. ? ?Risk ?Prescription drug management. ? ? ?26 yo F with a significant past medical history of CKD 3, has had multiple prior abdominal surgeries.  Comes in with a chief complaint of edema.  Will obtain a laboratory evaluation to assess for worsening renal dysfunction.  LFTs to assess for acute liver dysfunction.  BNP.  Chest x-ray.  EKG. ? ?Chest x-ray independently interpreted by me without focal infiltrate.  No signs of pulmonary edema.  BNP normal renal function normal and improved from baseline.  No LFT elevation.  Suspect she has dependent edema.  Will start on a short course of Lasix.  Have her follow-up with her family doctor. ? ?7:55 PM:  I have discussed the diagnosis/risks/treatment options with the patient.  Evaluation and diagnostic testing in the emergency department does not suggest an emergent condition requiring admission or immediate intervention beyond what has been performed at this time.  They will follow up with  PCP. We also discussed returning to the ED immediately if new or worsening sx occur. We discussed the sx which are most concerning (e.g., sudden worsening pain, fever, inability to tolerate by mouth) that necessitate immediate return. Medications administered to the patient during their visit and any new prescriptions provided to the patient are listed below. ? ?Medications given during this  visit ?Medications  ?albuterol (VENTOLIN HFA) 108 (90 Base) MCG/ACT inhaler 2 puff (has no administration in time range)  ?furosemide (LASIX) injection 20 mg (has no administration in time range)  ? ? ? ?The patient appears r

## 2021-11-27 NOTE — Discharge Instructions (Addendum)
There was no obvious signs that you had heart failure kidney failure or liver failure.  Please continue to take your Lasix at home.  Please follow-up with your family doctor in the office. ? ?Typically this uses potassium to try and help get fluid out of your body.  Try to increase your dietary potassium try to eat something that is high in potassium with each meal for the next week. ?

## 2022-01-14 ENCOUNTER — Emergency Department (HOSPITAL_BASED_OUTPATIENT_CLINIC_OR_DEPARTMENT_OTHER)
Admission: EM | Admit: 2022-01-14 | Discharge: 2022-01-14 | Disposition: A | Discharge status: Home / Self Care | Payer: BC Managed Care – PPO | Attending: Emergency Medicine | Admitting: Emergency Medicine

## 2022-01-14 ENCOUNTER — Emergency Department (HOSPITAL_BASED_OUTPATIENT_CLINIC_OR_DEPARTMENT_OTHER): Payer: BC Managed Care – PPO

## 2022-01-14 ENCOUNTER — Encounter (HOSPITAL_BASED_OUTPATIENT_CLINIC_OR_DEPARTMENT_OTHER): Payer: Self-pay | Admitting: Emergency Medicine

## 2022-01-14 DIAGNOSIS — N183 Chronic kidney disease, stage 3 unspecified: Secondary | ICD-10-CM | POA: Diagnosis not present

## 2022-01-14 DIAGNOSIS — I129 Hypertensive chronic kidney disease with stage 1 through stage 4 chronic kidney disease, or unspecified chronic kidney disease: Secondary | ICD-10-CM | POA: Diagnosis not present

## 2022-01-14 DIAGNOSIS — Z79899 Other long term (current) drug therapy: Secondary | ICD-10-CM | POA: Insufficient documentation

## 2022-01-14 DIAGNOSIS — E1122 Type 2 diabetes mellitus with diabetic chronic kidney disease: Secondary | ICD-10-CM | POA: Insufficient documentation

## 2022-01-14 DIAGNOSIS — K529 Noninfective gastroenteritis and colitis, unspecified: Secondary | ICD-10-CM | POA: Insufficient documentation

## 2022-01-14 DIAGNOSIS — R112 Nausea with vomiting, unspecified: Secondary | ICD-10-CM | POA: Diagnosis present

## 2022-01-14 LAB — COMPREHENSIVE METABOLIC PANEL
ALT: 67 U/L — ABNORMAL HIGH (ref 0–44)
AST: 27 U/L (ref 15–41)
Albumin: 2.6 g/dL — ABNORMAL LOW (ref 3.5–5.0)
Alkaline Phosphatase: 79 U/L (ref 38–126)
Anion gap: 7 (ref 5–15)
BUN: 20 mg/dL (ref 6–20)
CO2: 28 mmol/L (ref 22–32)
Calcium: 8.1 mg/dL — ABNORMAL LOW (ref 8.9–10.3)
Chloride: 102 mmol/L (ref 98–111)
Creatinine, Ser: 0.86 mg/dL (ref 0.44–1.00)
GFR, Estimated: >60 mL/min (ref >60)
Glucose, Bld: 109 mg/dL — ABNORMAL HIGH (ref 70–99)
Potassium: 3.4 mmol/L — ABNORMAL LOW (ref 3.5–5.1)
Sodium: 137 mmol/L (ref 135–145)
Total Bilirubin: 1 mg/dL (ref 0.3–1.2)
Total Protein: 4.8 g/dL — ABNORMAL LOW (ref 6.5–8.1)

## 2022-01-14 LAB — CBC WITH DIFFERENTIAL/PLATELET
Abs Immature Granulocytes: 0.03 10*3/uL (ref 0.00–0.07)
Basophils Absolute: 0 10*3/uL (ref 0.0–0.1)
Basophils Relative: 1 %
Eosinophils Absolute: 0.1 10*3/uL (ref 0.0–0.5)
Eosinophils Relative: 1 %
HCT: 30.1 % — ABNORMAL LOW (ref 36.0–46.0)
Hemoglobin: 10.2 g/dL — ABNORMAL LOW (ref 12.0–15.0)
Immature Granulocytes: 0 %
Lymphocytes Relative: 33 %
Lymphs Abs: 2.4 10*3/uL (ref 0.7–4.0)
MCH: 32.2 pg (ref 26.0–34.0)
MCHC: 33.9 g/dL (ref 30.0–36.0)
MCV: 95 fL (ref 80.0–100.0)
Monocytes Absolute: 0.8 10*3/uL (ref 0.1–1.0)
Monocytes Relative: 11 %
Neutro Abs: 4 10*3/uL (ref 1.7–7.7)
Neutrophils Relative %: 54 %
Platelets: 348 10*3/uL (ref 150–400)
RBC: 3.17 MIL/uL — ABNORMAL LOW (ref 3.87–5.11)
RDW: 13.2 % (ref 11.5–15.5)
WBC: 7.3 10*3/uL (ref 4.0–10.5)
nRBC: 0 % (ref 0.0–0.2)

## 2022-01-14 LAB — HCG, SERUM, QUALITATIVE: Preg, Serum: NEGATIVE

## 2022-01-14 MED ORDER — ONDANSETRON HCL 8 MG PO TABS: 8.0000 mg | ORAL_TABLET | Freq: Three times a day (TID) | ORAL | 0 refills | Status: AC | PRN

## 2022-01-14 MED ORDER — KETOROLAC TROMETHAMINE 15 MG/ML IJ SOLN
15.0000 mg | Freq: Once | INTRAMUSCULAR | Status: AC
  Administered 2022-01-14: 15 mg via INTRAVENOUS
  Filled 2022-01-14: qty 1

## 2022-01-14 MED ORDER — SODIUM CHLORIDE 0.9 % IV BOLUS
1000.0000 mL | Freq: Once | INTRAVENOUS | Status: AC
Start: 2022-01-14 — End: 2022-01-14
  Administered 2022-01-14: 1000 mL via INTRAVENOUS

## 2022-01-14 MED ORDER — IPRATROPIUM-ALBUTEROL 0.5-2.5 (3) MG/3ML IN SOLN
3.0000 mL | RESPIRATORY_TRACT | Status: DC
  Administered 2022-01-14: 3 mL via RESPIRATORY_TRACT
  Filled 2022-01-14: qty 3

## 2022-01-14 MED ORDER — PANTOPRAZOLE SODIUM 40 MG IV SOLR
40.0000 mg | Freq: Once | INTRAVENOUS | Status: AC
  Administered 2022-01-14: 40 mg via INTRAVENOUS
  Filled 2022-01-14: qty 10

## 2022-01-14 MED ORDER — ONDANSETRON HCL 4 MG/2ML IJ SOLN
4.0000 mg | Freq: Once | INTRAMUSCULAR | Status: AC
  Administered 2022-01-14: 4 mg via INTRAVENOUS
  Filled 2022-01-14: qty 2

## 2022-01-14 NOTE — ED Notes (Signed)
Blood drawn and sent to lab for hold.

## 2022-01-14 NOTE — ED Triage Notes (Signed)
Patient endorses n/v since yesterday.  Patient also endorses "hypoglycemia", weakness, shortness of breath and body aches.  Patient also reports bilateral feet swelling.

## 2022-01-14 NOTE — ED Provider Notes (Signed)
[facility] Provider Note: Georgena Spurling, MD, FACEP  CSN: [id number] MRN: [id number] ARRIVAL: 01/14/22 at [place]: [place]  01/14/22 1:52 AM Olivia Landry is a 26 y.o. female with nausea, vomiting and diarrhea since yesterday.  She also has been feeling weak, having shortness of breath of breath (notably on exertion), sharp chest pain with respirations, and body aches.  She rates her body pain as a 10 out of 10.  Her throat feels tight and she is unsure if this is due to vomiting.  She also reports that her feet are swelling but this is a chronic problem.    She has a history of morbid obesity status post bariatric surgery.   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 No pertinent family history.  Social History   Tobacco Use   Smoking status: Never   Smokeless tobacco: Never  Vaping Use   Vaping Use: Never used  Substance Use Topics   Alcohol use: Not Currently   Drug use: Yes    Types: Marijuana    Prior to Admission medications   Medication Sig Start Date End Date Taking? Authorizing Provider  ondansetron (ZOFRAN) 8 MG tablet Take 1 tablet (8 mg total) by mouth every 8 (eight) hours as needed for nausea or vomiting. 01/14/22  Yes Fermon Ureta, MD  acetaminophen (TYLENOL) 500 MG tablet You can take 1000 mg every 6 hours as needed for pain.  You can buy this over the counter at any drug store.  Do not take more than 4000 mg of Tylenol per day it can harm your liver. Patient taking differently: You can take 1000 mg every 6 hours as needed for pain.  You can buy this over the counter at any drug store.  Do not take more  than 4000 mg of Tylenol per day it can harm your liver. 06/13/20   Earnstine Regal, PA-C  Amphetamine ER (ADZENYS XR-ODT) 18.8 MG TBED Take 1 tablet by mouth daily. 11/16/19   [provider]  amphetamine-dextroamphetamine (ADDERALL) 20 MG tablet Take 1 tablet by mouth daily.  08/15/17   [provider]  Cholecalciferol (VITAMIN D3) 125 MCG (5000 UT) TABS Take 5,000 Units by mouth daily.    [provider]  dicyclomine (BENTYL) 20 MG tablet Take 1 tablet (20 mg total) by mouth every 8 (eight) hours as needed for spasms. 10/11/21   Petrucelli, Samantha R, PA-C  ergocalciferol (VITAMIN D2) 1.25 MG (50000 UT) capsule Take 1 capsule by mouth daily. 07/22/14   [provider]  famotidine (PEPCID) 20 MG tablet Take 1 tablet (20 mg total) by mouth 2 (two) times daily as needed for heartburn or indigestion. 10/11/21   Petrucelli, Glynda Jaeger, PA-C  Ferrous Fumarate (HEMOCYTE - 106 MG FE) 324 (106 Fe) MG TABS tablet Take 324 mg of iron by mouth daily. 12/14/19   [provider]  furosemide (LASIX) 20 MG tablet Take 1 tablet (20 mg total) by mouth daily. 11/27/21   Deno Etienne, DO  LORazepam (ATIVAN) 0.5 MG tablet Take 1 tablet by mouth daily as needed.  [provider]  losartan-hydrochlorothiazide (HYZAAR) 50-12.5 MG tablet Take 2 tablets by mouth daily. 09/21/15   [provider]  Multiple Vitamin (MULTIVITAMIN) tablet Take 1 tablet by mouth daily.    [provider]  OXcarbazepine ER 600 MG TB24 Take 600 mg by mouth daily. 02/24/20   [provider]  OZEMPIC, 0.25 OR 0.5 MG/DOSE, 2 MG/1.5ML SOPN SMARTSIG:0.25 Milligram(s) SUB-Q Once a Week 12/06/20   [provider]  risperiDONE (RISPERDAL) 0.5 MG tablet SMARTSIG:1 Tablet(s) By Mouth Every Evening 11/08/20   [provider]  traMADol (ULTRAM) 50 MG tablet Take 1 tablet (50 mg total) by mouth every 6 (six) hours as needed. 02/14/21   Veryl Speak, MD     Allergies Amoxicillin, Keflex [cephalexin], Metformin, Penicillins, and Sumatriptan   REVIEW OF SYSTEMS  Negative except as noted here or in the History of Present Illness.   PHYSICAL EXAMINATION  Initial Vital Signs Blood pressure 122/75, pulse (!) 103, temperature 99 F (37.2 C), temperature source Oral, resp. rate 17, height 5\' 3"  (1.6 m), weight 69.9 kg, SpO2 100 %.  Examination General: Well-developed, well-nourished female in no acute distress; appearance consistent with age of record HENT: normocephalic; atraumatic; no pharyngeal erythema or exudate; geographic tongue Eyes: pupils equal, round and reactive to light; extraocular muscles intact Neck: supple Heart: regular rate and rhythm Lungs: Shallow breaths with decreased air movement Abdomen: soft; nondistended; nontender; bowel sounds present Extremities: No deformity; full range of motion; edema of lower legs and feet Neurologic: Awake, alert and oriented; motor function intact in all extremities and symmetric; no facial droop Skin: Warm and dry Psychiatric: Normal mood and affect   RESULTS  Summary of this visit's results, reviewed and interpreted by myself:   EKG Interpretation  Date/Time:  Sunday January 14 2022 01:07:52 EDT Ventricular Rate:  103 PR Interval:  125 QRS Duration: 87 QT Interval:  351 QTC Calculation: 460 R Axis:   95 Text Interpretation: Sinus tachycardia Borderline right axis deviation No significant change was found Confirmed by Hamdi Vari, Jenny Reichmann [phone]) on 01/14/2022 1:21:12 AM       Laboratory Studies: Results for orders placed or performed during the hospital encounter of 01/14/22 (from the past 24 hour(s))  Comprehensive metabolic panel     Status: Abnormal   Collection Time: 01/14/22  2:00 AM  Result Value Ref Range   Sodium 137 135 - 145 mmol/L   Potassium 3.4 (L) 3.5 - 5.1 mmol/L   Chloride 102 98 - 111 mmol/L   CO2 28 22 - 32 mmol/L   Glucose, Bld 109 (H) 70 - 99 mg/dL   BUN  20 6 - 20 mg/dL   Creatinine, Ser 0.86 0.44 - 1.00 mg/dL   Calcium 8.1 (L) 8.9 - 10.3 mg/dL   Total Protein 4.8 (L) 6.5 - 8.1 g/dL   Albumin 2.6 (L) 3.5 - 5.0 g/dL   AST 27 15 - 41 U/L   ALT 67 (H) 0 - 44 U/L   Alkaline Phosphatase 79 38 - 126 U/L   Total Bilirubin 1.0 0.3 - 1.2 mg/dL   GFR, Estimated >60 >60 mL/min   Anion gap 7 5 - 15  CBC with Differential     Status: Abnormal   Collection Time: 01/14/22  2:00 AM  Result Value Ref Range   WBC 7.3 4.0 - 10.5 K/uL   RBC 3.17 (L) 3.87 - 5.11 MIL/uL   Hemoglobin 10.2 (L) 12.0 - 15.0 g/dL   HCT 30.1 (L) 36.0 - 46.0 %  MCV 95.0 80.0 - 100.0 fL   MCH 32.2 26.0 - 34.0 pg   MCHC 33.9 30.0 - 36.0 g/dL   RDW 13.2 11.5 - 15.5 %   Platelets 348 150 - 400 K/uL   nRBC 0.0 0.0 - 0.2 %   Neutrophils Relative % 54 %   Neutro Abs 4.0 1.7 - 7.7 K/uL   Lymphocytes Relative 33 %   Lymphs Abs 2.4 0.7 - 4.0 K/uL   Monocytes Relative 11 %   Monocytes Absolute 0.8 0.1 - 1.0 K/uL   Eosinophils Relative 1 %   Eosinophils Absolute 0.1 0.0 - 0.5 K/uL   Basophils Relative 1 %   Basophils Absolute 0.0 0.0 - 0.1 K/uL   Immature Granulocytes 0 %   Abs Immature Granulocytes 0.03 0.00 - 0.07 K/uL  hCG, serum, qualitative     Status: None   Collection Time: 01/14/22  2:00 AM  Result Value Ref Range   Preg, Serum NEGATIVE NEGATIVE   Imaging Studies: DG Chest Port 1 View  Result Date: 01/14/2022 CLINICAL DATA:  Shortness of breath EXAM: PORTABLE CHEST 1 VIEW COMPARISON:  11/27/2021 FINDINGS: The heart size and mediastinal contours are within normal limits. Both lungs are clear. The visualized skeletal structures are unremarkable. IMPRESSION: No active disease. Electronically Signed   By: Rolm Baptise M.D.   On: 01/14/2022 02:22    ED COURSE and MDM  Nursing notes, initial and subsequent vitals signs, including pulse oximetry, reviewed and interpreted by myself.  Vitals:   01/14/22 0107 01/14/22 0109 01/14/22 0233 01/14/22 0300  BP:  122/75  122/76   Pulse:  (!) 103    Resp:  17  (!) 22  Temp:  99 F (37.2 C)    TempSrc:  Oral    SpO2:  100% 100% 100%  Weight: 69.9 kg     Height: 5\' 3"  (1.6 m)      Medications  ipratropium-albuterol (DUONEB) 0.5-2.5 (3) MG/3ML nebulizer solution 3 mL (3 mLs Nebulization Given 01/14/22 0232)  sodium chloride 0.9 % bolus 1,000 mL (1,000 mLs Intravenous New Bag/Given 01/14/22 0214)  ondansetron (ZOFRAN) injection 4 mg (4 mg Intravenous Given 01/14/22 0214)  pantoprazole (PROTONIX) injection 40 mg (40 mg Intravenous Given 01/14/22 0215)  ketorolac (TORADOL) 15 MG/ML injection 15 mg (15 mg Intravenous Given 01/14/22 0255)   3:20 AM Patient feeling better and able to drink fluids without vomiting.  Her chest pain is improved after IV Toradol.  She did not note any significant difference in her breathing with a DuoNeb treatment and she is still having shallow breaths.  I suspect these are due to her pleuritic chest pain.  I suspect her symptoms are due to a viral gastroenteritis.  She does not have abdominal pain to suggest a complication of her bariatric surgery.   PROCEDURES  Procedures   ED DIAGNOSES     ICD-10-CM   1. Gastroenteritis  K52.9          Olivia Gemmill, MD 01/14/22 [phone]

## 2022-09-08 IMAGING — CT CT ABD-PELV W/ CM
2 of 4 series · 15 of 46 positions shown, 17 images · IV contrast (Omnipaque)
Comparison: CT 10 days ago 10/10/2021, additional priors

CLINICAL DATA: Abdominal pain. Patient reports left-sided pain.
Previous bariatric surgery with duodenal switch.

EXAM:
CT ABDOMEN AND PELVIS WITH CONTRAST
TECHNIQUE: Multidetector CT imaging of the abdomen and pelvis was performed
using the standard protocol following bolus administration of
intravenous contrast.

[Series 2: axial st · axial · 0.72mm/px · z∈[-460,-94]mm · 12 of 87 slices shown, 14 images]
[im 7/87  soft-tissue]
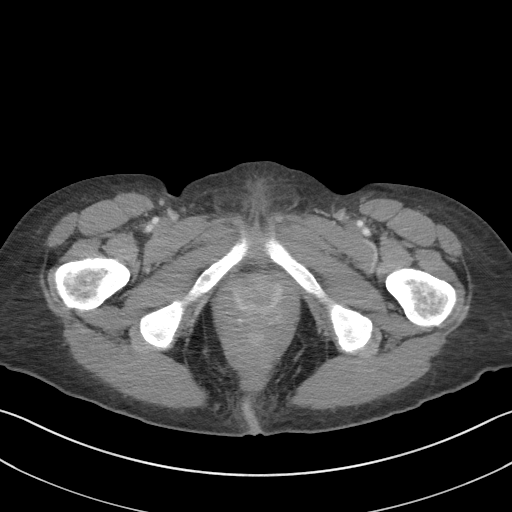
[im 7/87  bone]
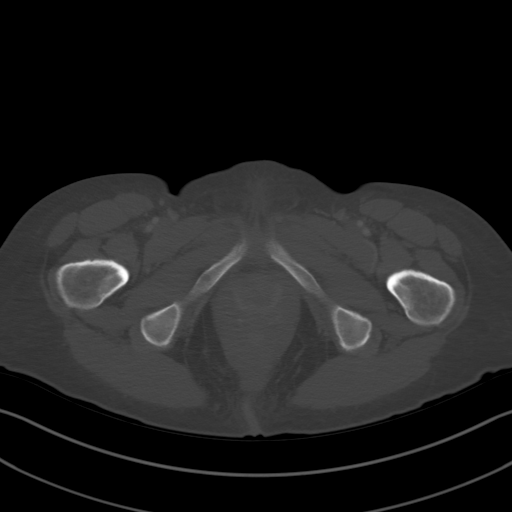
[im 14/87  soft-tissue]
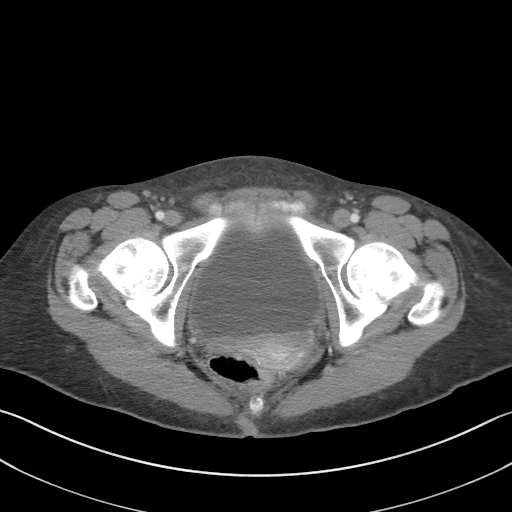
[im 20/87  soft-tissue]
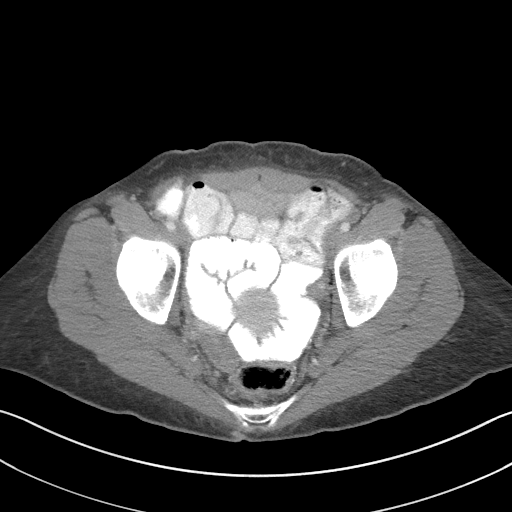
[im 27/87  soft-tissue]
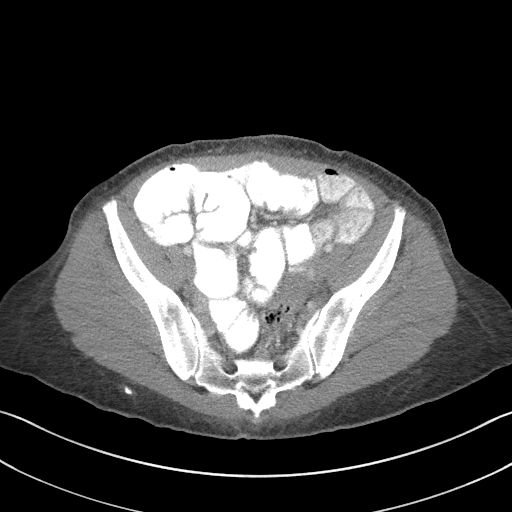
[im 34/87  soft-tissue]
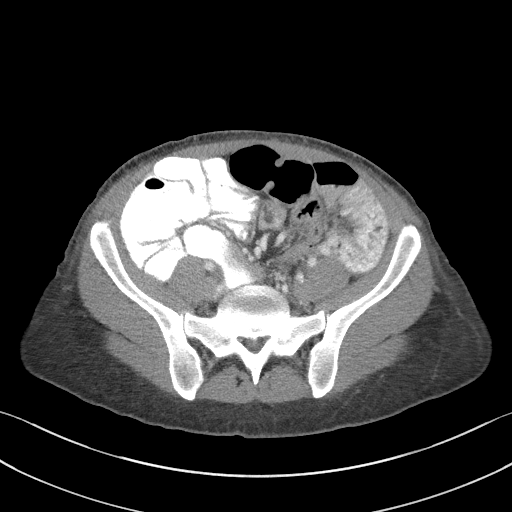
[im 40/87  soft-tissue]
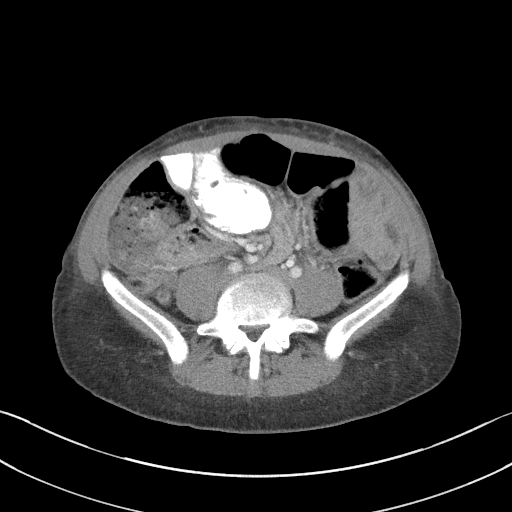
[im 47/87  soft-tissue]
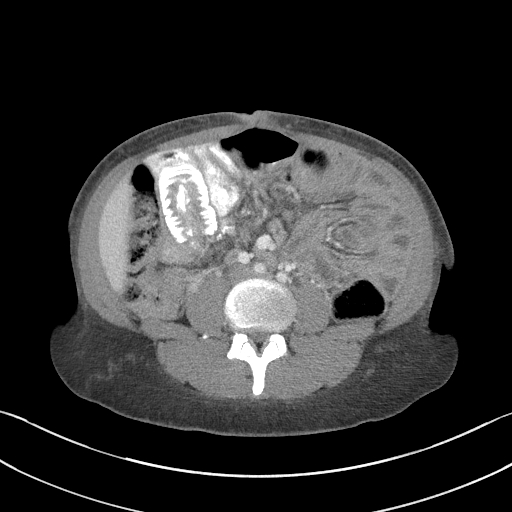
[im 53/87  soft-tissue]
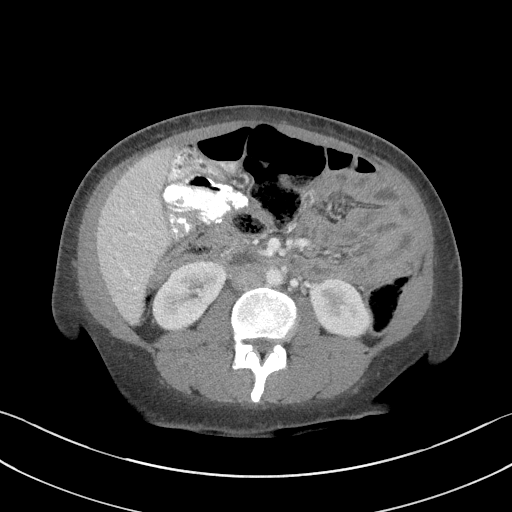
[im 60/87  soft-tissue]
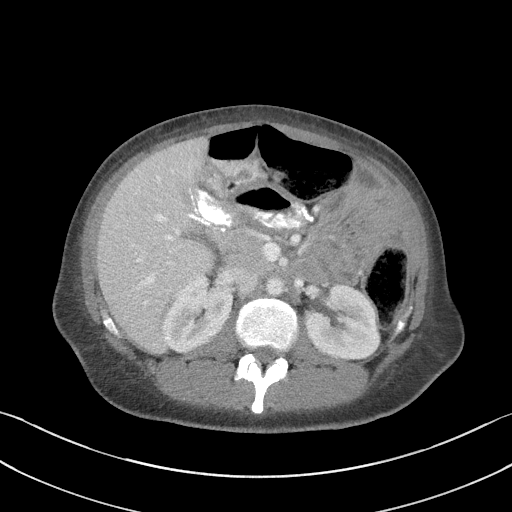
[im 60/87  bone]
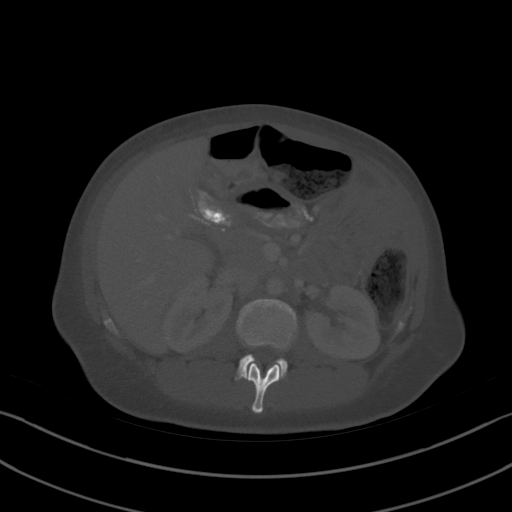
[im 67/87  soft-tissue]
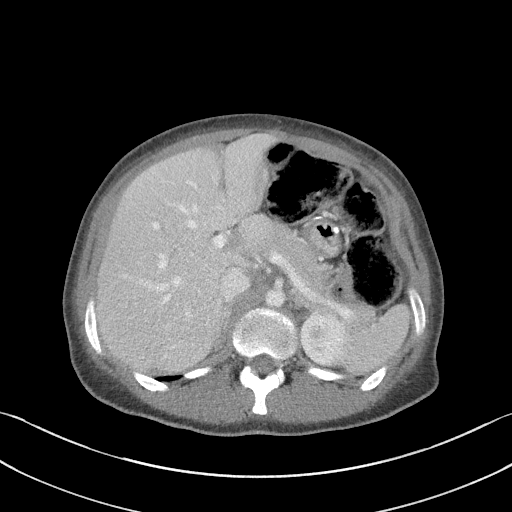
[im 73/87  soft-tissue]
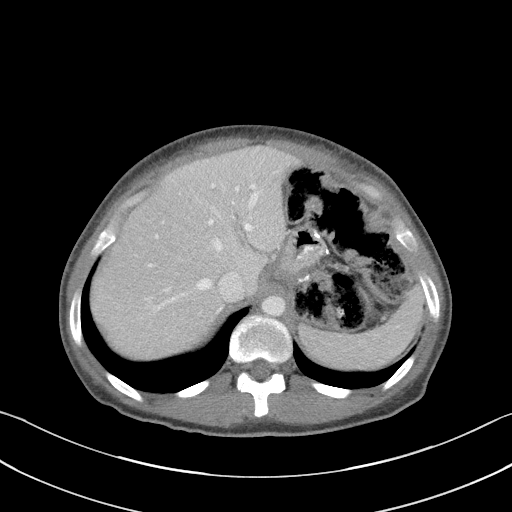
[im 80/87  soft-tissue]
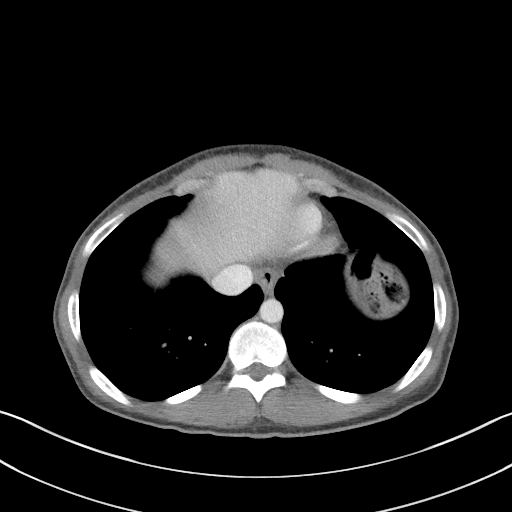

[Series 5: coronal st · coronal · 0.73mm/px · 3 of 81 slices shown]
[im 27/81  soft-tissue]
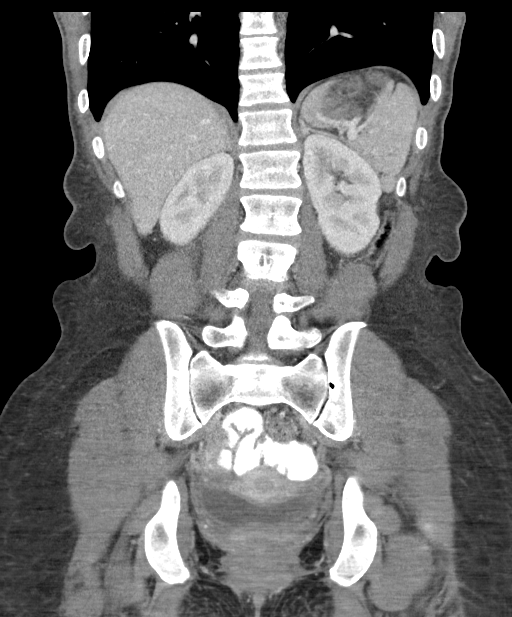
[im 36/81  soft-tissue]
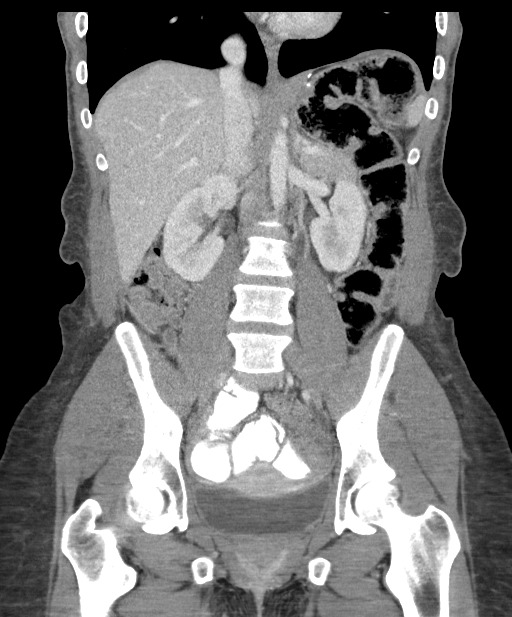
[im 45/81  soft-tissue]
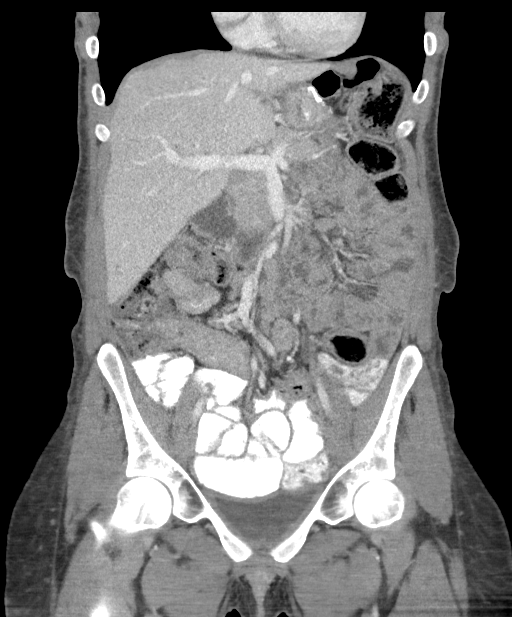

[15 of 46 positions shown; findings below may reference images not displayed]

RADIATION DOSE REDUCTION: This exam was performed according to the
departmental dose-optimization program which includes automated
exposure control, adjustment of the mA and/or kV according to
patient size and/or use of iterative reconstruction technique.

CONTRAST:  80mL OMNIPAQUE IOHEXOL 300 MG/ML  SOLN
FINDINGS: Lower chest: Clear lung bases.

Hepatobiliary: No focal liver lesions are seen. Cholecystectomy
without biliary dilatation.

Pancreas: No ductal dilatation or inflammation.

Spleen: Normal in size without focal abnormality.

Adrenals/Urinary Tract: Normal adrenal glands. No hydronephrosis or
focal renal abnormalities. No perinephric edema. Unremarkable
urinary bladder.

Stomach/Bowel: Bariatric surgery with duodenal switch procedure.
There is a small bowel intussusception in the right upper quadrant
spanning 5.5 cm in length, series 2, image 42. This is new from
prior exam, and may be at the pancreatico biliary limb anastomosis.
Administered enteric contrast progresses distal to this suggesting
this is transient. Enteric contrast reaches the mid and distal small
bowel. There is no small bowel obstruction. Few persistent
fluid-filled loops of small bowel in the left abdomen with decreased
small bowel distension from prior, equivocal thickening but no
adjacent inflammation. Fecalization of small bowel in the left lower
abdomen and pelvis. Mesenteric swirling of the small bowel mesentery
in the lower abdomen, potentially chronic and also seen on prior
exam, no associated ischemic change. No bowel pneumatosis. There is
a moderate volume of stool throughout the colon. There is fluid
within the appendix which is otherwise normal, no periappendiceal
fat stranding.

Vascular/Lymphatic: Normal caliber abdominal aorta. Patent portal
and mesenteric veins. No portal venous or mesenteric gas. No
enlarged lymph nodes in the abdomen or pelvis.

Reproductive: Uterus and bilateral adnexa are unremarkable.

Other: No ascites, free air or focal fluid collection. No abdominal
wall hernia.

Musculoskeletal: There are no acute or suspicious osseous
abnormalities.
IMPRESSION: 1. Small bowel intussusception in the right upper quadrant spanning
5.5 cm in length, new from prior exam. This appears to be at the
pancreaticobiliary limb anastomosis. Administered enteric contrast
progresses distal to this suggesting this is nonobstructing and or
transient.
2. Mesenteric swirling of the small bowel mesentery in the lower
abdomen, potentially chronic. No obstructive change.
3. Persistent but decreased fluid-filled small bowel in the left
abdomen, now with equivocal small bowel wall thickening.
4. Findings suggesting slow bowel transit with fecalized distal
small bowel contents and moderate colonic stool burden.

## 2022-12-03 IMAGING — DX DG CHEST 1V PORT
1 series · 1 of 1 positions shown · non-contrast
Comparison: 11/27/2021

CLINICAL DATA: Shortness of breath

EXAM:
PORTABLE CHEST 1 VIEW

[chest ap]
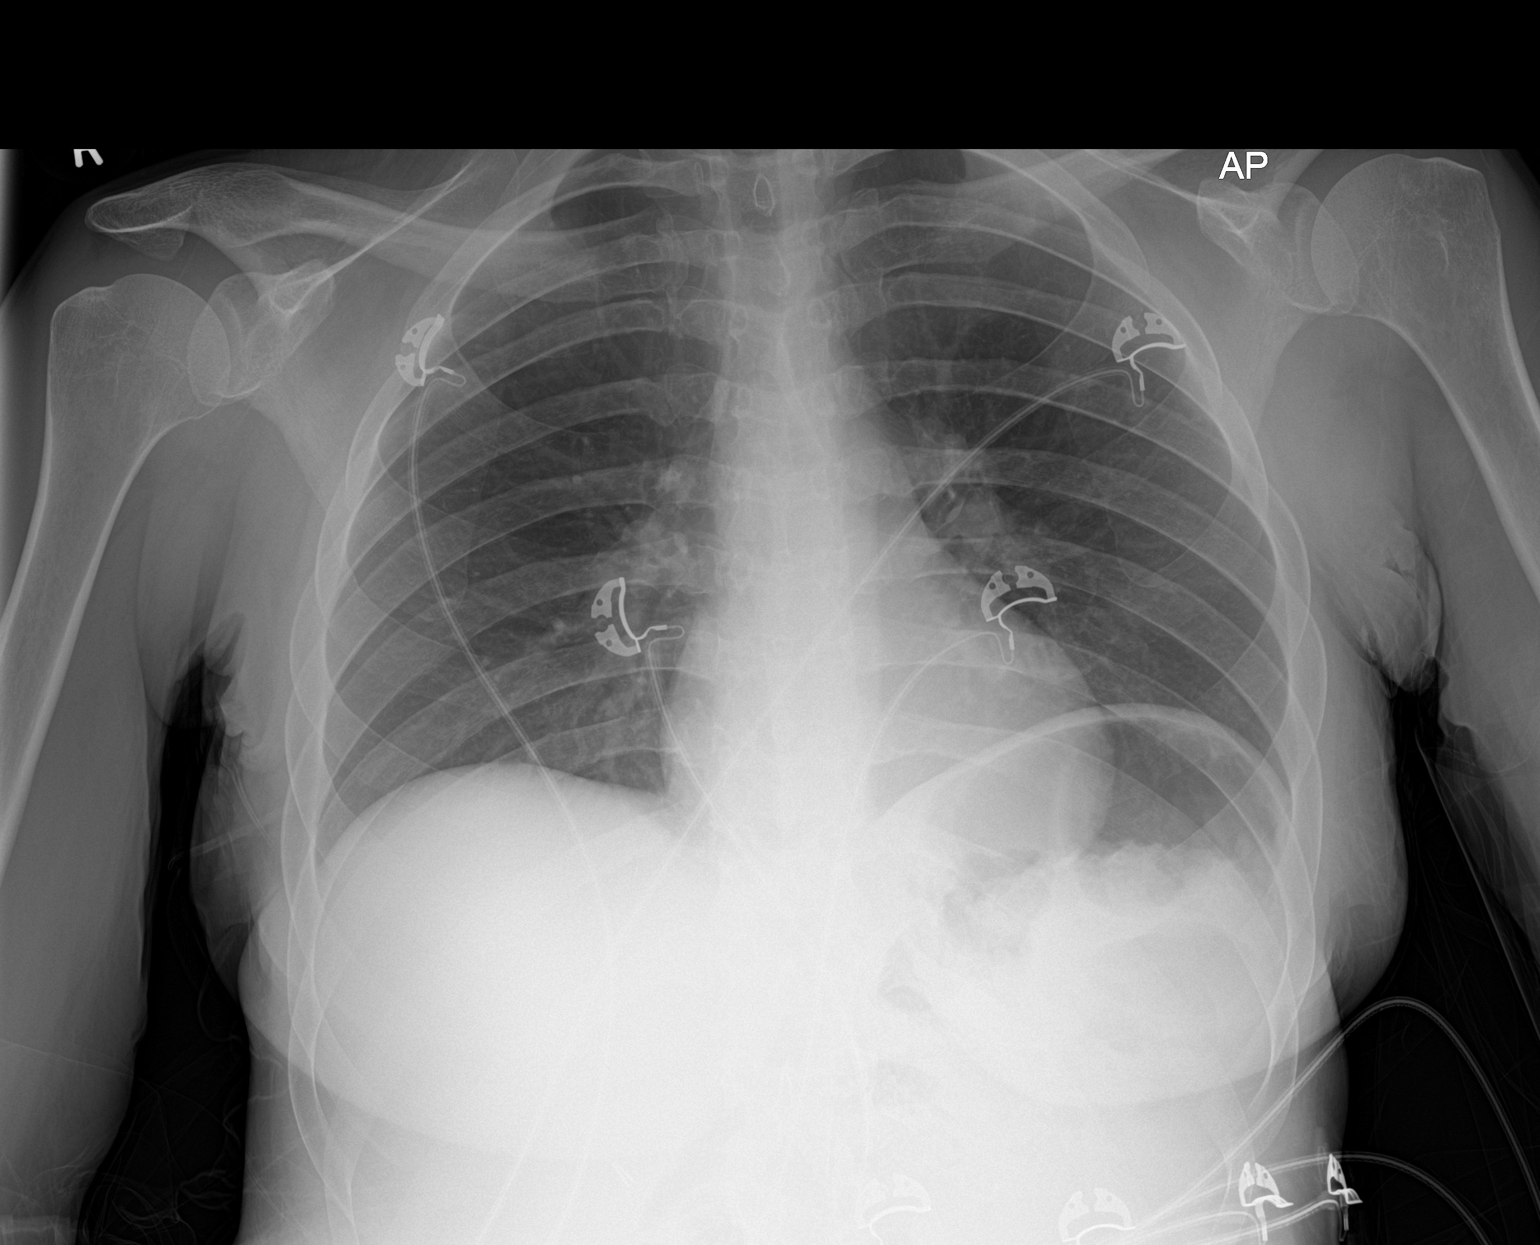

[1 of 1 positions shown; findings below may reference images not displayed]

FINDINGS: The heart size and mediastinal contours are within normal limits.
Both lungs are clear. The visualized skeletal structures are
unremarkable.
IMPRESSION: No active disease.
